# Patient Record
Sex: Male | Born: 1950 | Race: White | Hispanic: No | Marital: Married | Smoking: Never smoker
Health system: Southern US, Community
[De-identification: ages and names within clinical notes are randomized; demographics above are authoritative.]

---

## 2005-09-17 ENCOUNTER — Other Ambulatory Visit: Payer: Self-pay

## 2005-09-17 ENCOUNTER — Emergency Department: Payer: Self-pay | Admitting: Emergency Medicine

## 2009-02-13 ENCOUNTER — Ambulatory Visit: Payer: Self-pay | Admitting: Surgery

## 2009-02-19 ENCOUNTER — Ambulatory Visit: Payer: Self-pay | Admitting: Surgery

## 2009-05-02 ENCOUNTER — Ambulatory Visit: Payer: Self-pay | Admitting: Gastroenterology

## 2015-02-27 ENCOUNTER — Other Ambulatory Visit: Payer: Self-pay | Admitting: Preventative Medicine

## 2015-02-27 DIAGNOSIS — N5089 Other specified disorders of the male genital organs: Secondary | ICD-10-CM

## 2015-03-01 ENCOUNTER — Ambulatory Visit
Admission: RE | Admit: 2015-03-01 | Discharge: 2015-03-01 | Disposition: A | Discharge status: Home / Self Care | Payer: Medicare HMO | Source: Ambulatory Visit | Attending: Preventative Medicine | Admitting: Preventative Medicine

## 2015-03-01 DIAGNOSIS — N433 Hydrocele, unspecified: Secondary | ICD-10-CM | POA: Insufficient documentation

## 2015-03-01 DIAGNOSIS — N509 Disorder of male genital organs, unspecified: Secondary | ICD-10-CM | POA: Insufficient documentation

## 2015-03-01 DIAGNOSIS — N5089 Other specified disorders of the male genital organs: Secondary | ICD-10-CM

## 2016-05-14 IMAGING — US US ART/VEN ABD/PELV/SCROTUM DOPPLER LTD
1 series · 13 of 25 positions shown · non-contrast
Comparison: None in PACs

CLINICAL DATA: [DATE] week history of left-sided scrotal swelling and
mass.

EXAM:
SCROTAL ULTRASOUND
DOPPLER ULTRASOUND OF THE TESTICLES
TECHNIQUE: Complete ultrasound examination of the testicles, epididymis, and
other scrotal structures was performed. Color and spectral Doppler
ultrasound were also utilized to evaluate blood flow to the
testicles.

[Series 1: us art/ven abd/pelv/scrotum doppler ltd · 0.09mm/px · 13 of 114 slices shown]
[im 1/114]
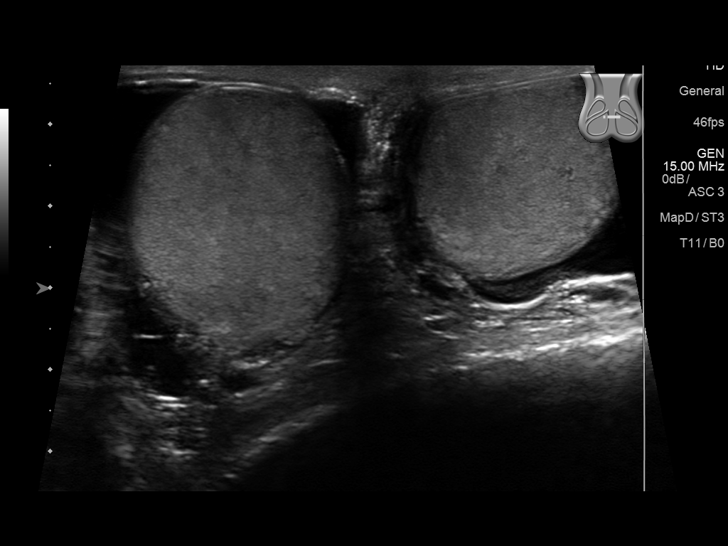
[im 10/114]
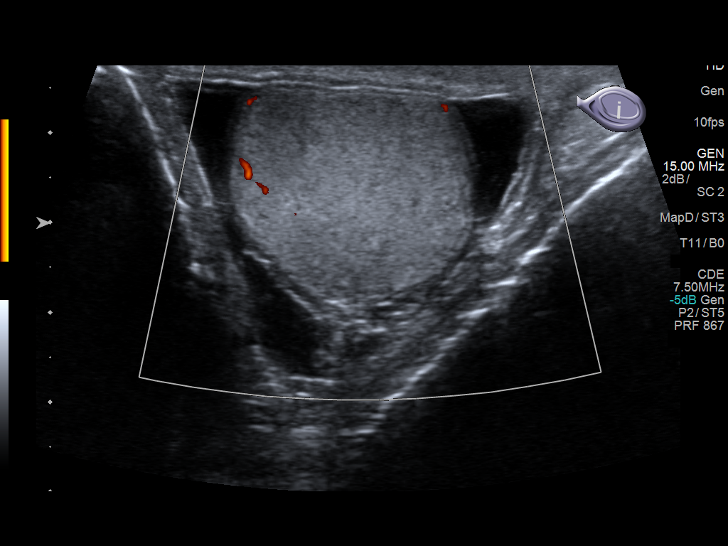
[im 19/114]
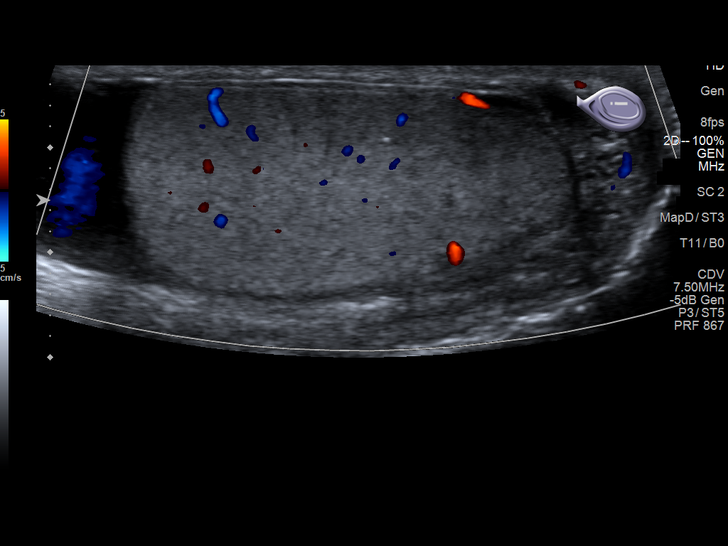
[im 29/114]
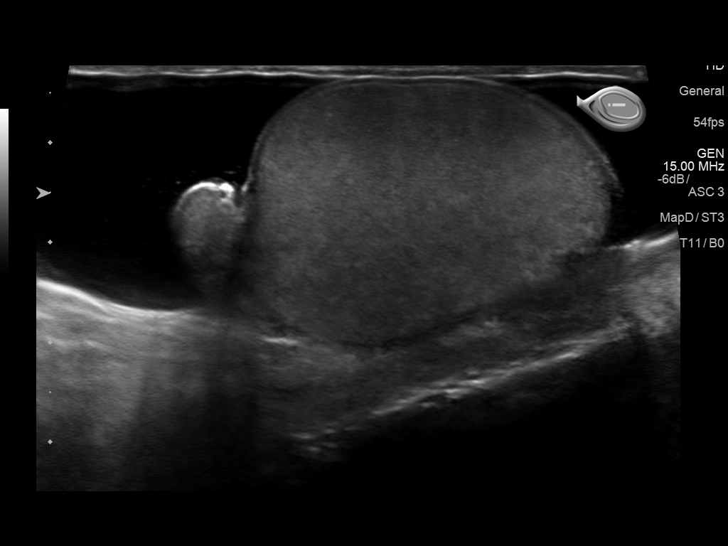
[im 38/114]
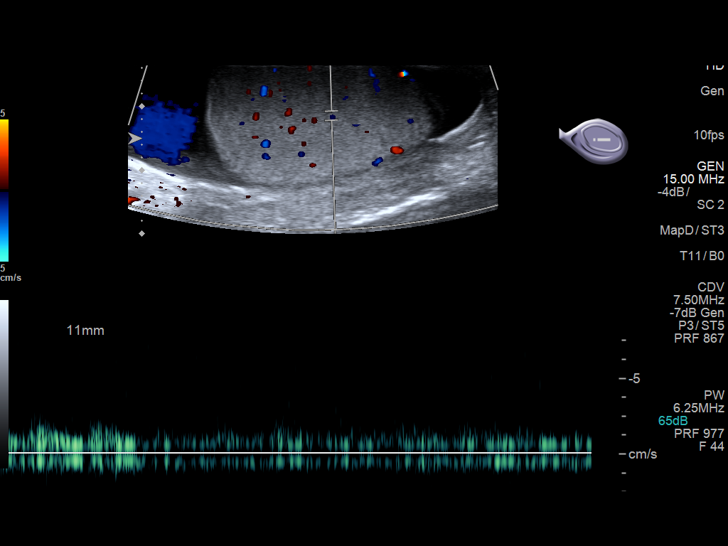
[im 48/114]
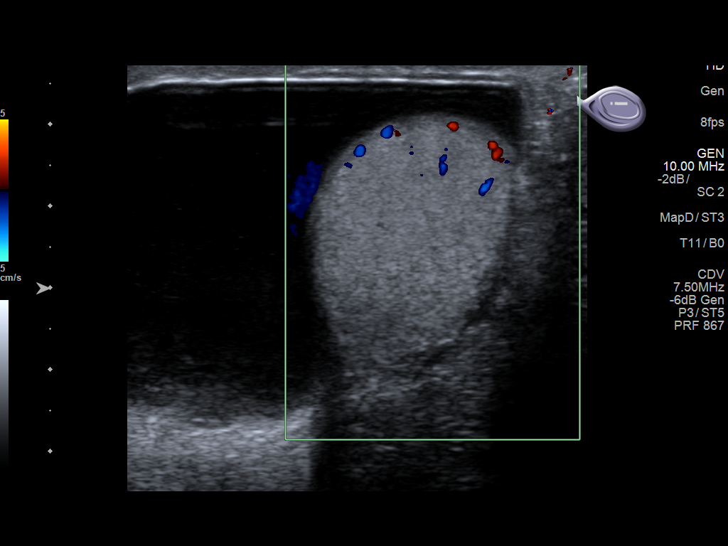
[im 57/114]
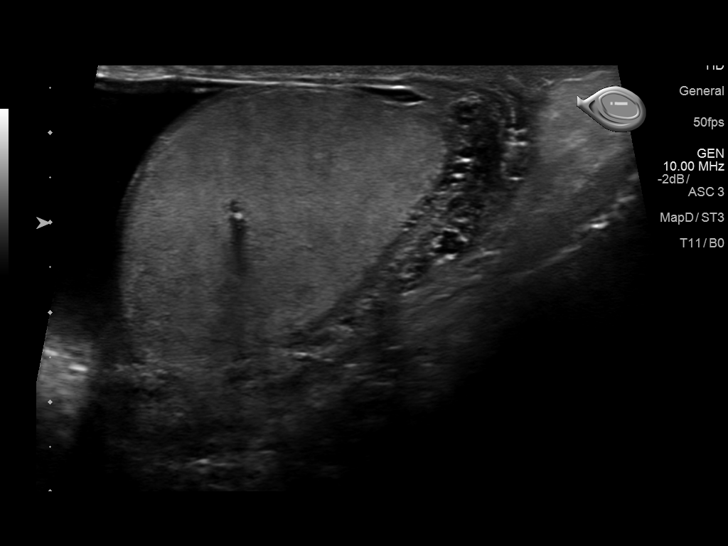
[im 66/114]
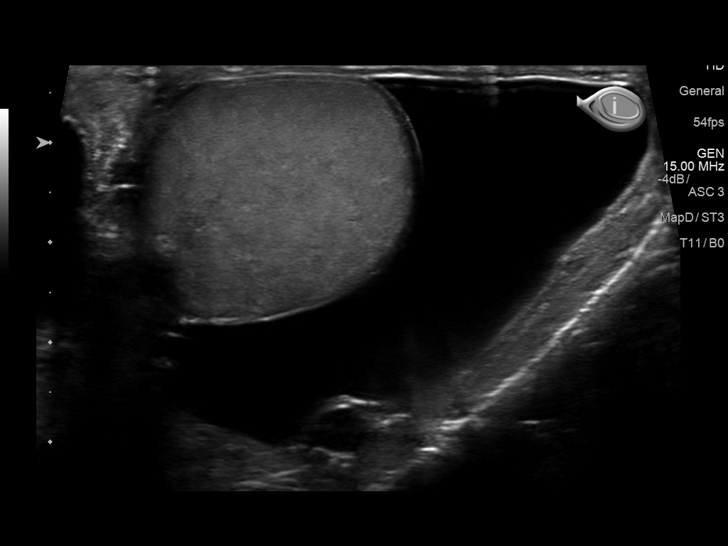
[im 76/114]
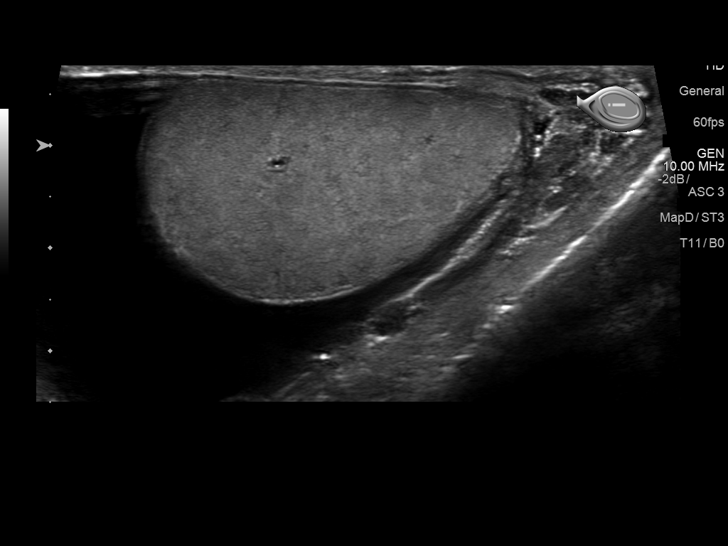
[im 85/114]
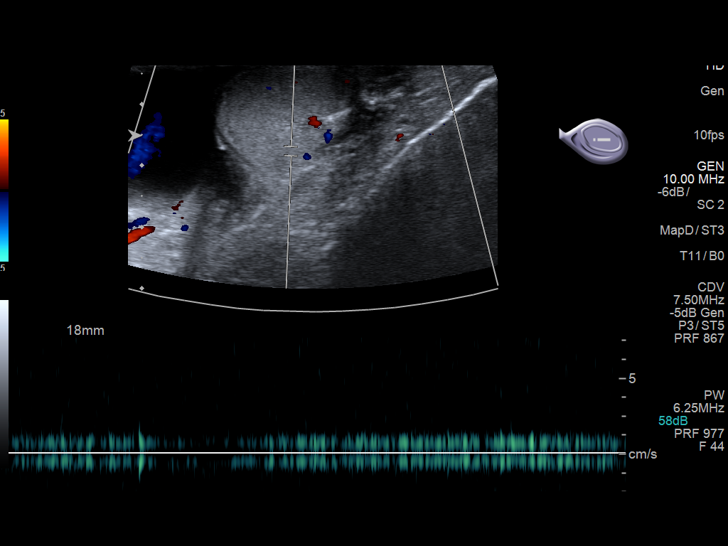
[im 95/114]
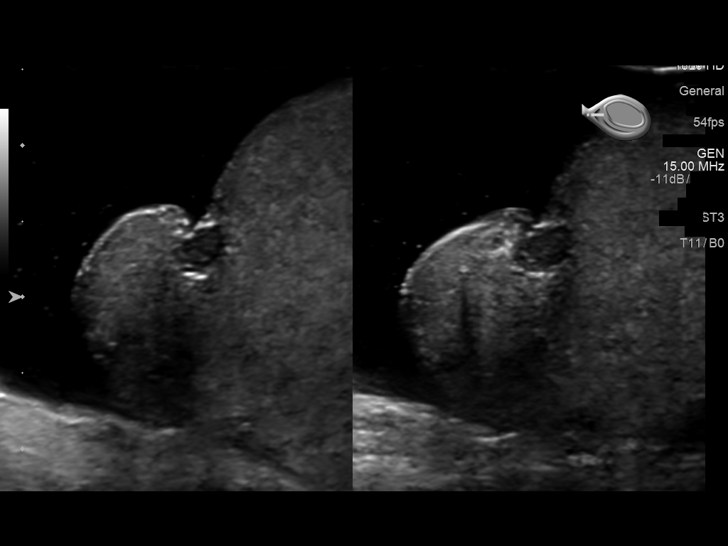
[im 104/114]
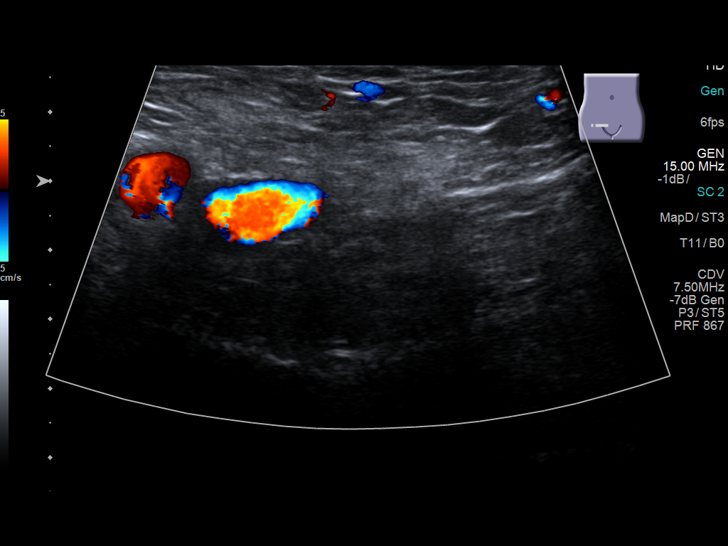
[im 114/114]
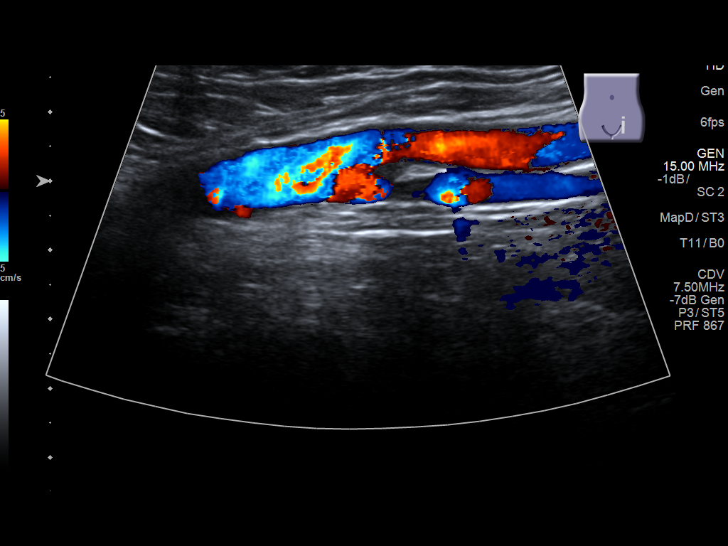

[13 of 25 positions shown; findings below may reference images not displayed]

FINDINGS: Right testicle

Measurements: 4.3 x 2.1 x 2.7 cm. No mass or microlithiasis
visualized.

Left testicle

Measurements: 3.7 x 2.7 x 2.7 cm. No mass or microlithiasis
visualized.

Right epididymis: The epididymis exhibits normal echotexture and
vascularity. There is a tiny epididymal cyst measuring 4 mm in
greatest dimension.

Left epididymis:  Normal in size and appearance.

Hydrocele:  There are moderate-sized bilateral hydroceles.

Varicocele:  None visualized.

Pulsed Doppler interrogation of both testes demonstrates normal low
resistance arterial and venous waveforms bilaterally.

Interrogation of the left inguinal canal reveals no evidence of a
hernia.
IMPRESSION: 1. The testes exhibit normal echotexture and vascularity. No
testicular masses are observed. Testicular vascularity is normal.
2. No evidence of epididymal masses or acute epididymitis.
3. Bilateral hydroceles.  No varicocele is observed.
4. No left inguinal hernia is demonstrated.

## 2020-03-28 ENCOUNTER — Ambulatory Visit: Payer: Medicare HMO | Admitting: Dermatology

## 2020-03-28 ENCOUNTER — Other Ambulatory Visit: Payer: Self-pay

## 2020-03-28 DIAGNOSIS — L719 Rosacea, unspecified: Secondary | ICD-10-CM

## 2020-03-28 DIAGNOSIS — L820 Inflamed seborrheic keratosis: Secondary | ICD-10-CM

## 2020-03-28 DIAGNOSIS — L578 Other skin changes due to chronic exposure to nonionizing radiation: Secondary | ICD-10-CM

## 2020-03-28 NOTE — Patient Instructions (Addendum)
Seborrheic Keratosis  What causes seborrheic keratoses? Seborrheic keratoses are harmless, common skin growths that first appear during adult life.  As time goes by, more growths appear.  Some people may develop a large number of them.  Seborrheic keratoses appear on both covered and uncovered body parts.  They are not caused by sunlight.  The tendency to develop seborrheic keratoses can be inherited.  They vary in color from skin-colored to gray, brown, or even black.  They can be either smooth or have a rough, warty surface.   Seborrheic keratoses are superficial and look as if they were stuck on the skin.  Under the microscope this type of keratosis looks like layers upon layers of skin.  That is why at times the top layer may seem to fall off, but the rest of the growth remains and re-grows.    Treatment Seborrheic keratoses do not need to be treated, but can easily be removed in the office.  Seborrheic keratoses often cause symptoms when they rub on clothing or jewelry.  Lesions can be in the way of shaving.  If they become inflamed, they can cause itching, soreness, or burning.  Removal of a seborrheic keratosis can be accomplished by freezing, burning, or surgery. If any spot bleeds, scabs, or grows rapidly, please return to have it checked, as these can be an indication of a skin cancer.     Instructions for [facility] Medications  One or more of your medications was sent to the [facility] mail order compounding pharmacy. You will receive an email from them and can purchase the medicine through that link. It will then be mailed to your home at the address you confirmed. If for any reason you do not receive an email from them, please check your spam folder. If you still do not find the email, please let us know. [facility] phone number is [phone].

## 2020-03-28 NOTE — Progress Notes (Signed)
   Follow-Up Visit   Subjective  Edward Garza is a 70 y.o. male who presents for the following: Spot Check (Pt would like to have a few spots on his back and chest checked today. Pt unsure if new or changed.).  The following portions of the chart were reviewed this encounter and updated as appropriate:  Allergies  Meds  Problems  Med Hx  Surg Hx  Fam Hx     Review of Systems: No other skin or systemic complaints except as noted in HPI or Assessment and Plan.  Objective  Well appearing patient in no apparent distress; mood and affect are within normal limits.  A focused examination was performed including back and chest. Relevant physical exam findings are noted in the Assessment and Plan.  Objective  back x 2, chest x 1, right forearm x 4, left forearm x 2 (9): Erythematous keratotic or waxy stuck-on papule or plaque.   Objective  Head - Anterior (Face): Pustules and erythema on forehead  Assessment & Plan  Inflamed seborrheic keratosis (9) back x 2, chest x 1, right forearm x 4, left forearm x 2  Destruction of lesion - back x 2, chest x 1, right forearm x 4, left forearm x 2 Complexity: simple   Destruction method: cryotherapy   Informed consent: discussed and consent obtained   Timeout:  patient name, date of birth, surgical site, and procedure verified Lesion destroyed using liquid nitrogen: Yes   Region frozen until ice ball extended beyond lesion: Yes   Outcome: patient tolerated procedure well with no complications   Post-procedure details: wound care instructions given    Rosacea Head - Anterior (Face) Start triple rosacea cream from [facility].  Will consider oral doxycycline in future if cream does not help.  Will prescribe [facility] metronidazole/ivermectin/azelaic acid twice daily as needed to affected areas on the face. The patient was advised this is not covered by insurance since it is made by a compounding pharmacy. They will receive a  phone call to check out and the medication will be mailed to their home.   Rosacea is a chronic progressive skin condition usually affecting the face of adults, causing redness and/or acne bumps. It is treatable but not curable. It sometimes affects the eyes (ocular rosacea) as well. It may respond to topical and/or systemic medication and can flare with stress, sun exposure, alcohol, exercise and some foods.  Daily application of broad spectrum spf 30+ sunscreen to face is recommended to reduce flares.  Actinic Damage - chronic, secondary to cumulative UV radiation exposure/sun exposure over time - diffuse scaly erythematous macules with underlying dyspigmentation - Recommend daily broad spectrum sunscreen SPF 30+ to sun-exposed areas, reapply every 2 hours as needed.  - Call for new or changing lesions.  Return in about 1 year (around 03/28/2021) for TSBE .   IHarriett Sine, CMA, am acting as scribe for Sarina Ser, MD.  Documentation: I have reviewed the above documentation for accuracy and completeness, and I agree with the above.  Sarina Ser, MD

## 2020-03-30 ENCOUNTER — Encounter: Payer: Self-pay | Admitting: Dermatology

## 2020-10-01 ENCOUNTER — Ambulatory Visit: Payer: Medicare HMO | Admitting: Urology

## 2020-10-01 ENCOUNTER — Other Ambulatory Visit: Payer: Self-pay

## 2020-10-01 ENCOUNTER — Encounter: Payer: Self-pay | Admitting: Urology

## 2020-10-01 VITALS — BP 138/80 | HR 64 | Ht 64.0 in | Wt 165.0 lb

## 2020-10-01 DIAGNOSIS — R972 Elevated prostate specific antigen [PSA]: Secondary | ICD-10-CM

## 2020-10-01 NOTE — Patient Instructions (Signed)
Prostate Cancer Screening Prostate cancer screening is a test that is done to check for the presence of prostate cancer in men. The prostate gland is a walnut-sized gland that is located below the bladder and in front of the rectum in males. The function of the prostate is to add fluid to semen during ejaculation. Prostate cancer is the second most common type of cancer in men. Who should have prostate cancer screening? Screening recommendations vary based on age and other risk factors. Screening is recommended if: You are older than age 78. If you are age 36-69, talk with your health care provider about your need for screening and how often screening should be done. Because most prostate cancers are slow growing and will not cause death, screening is generally reserved in this age group for men who have a 10-15-year life expectancy. You are younger than age 62, and you have these risk factors: Being a [place] male or a male of African descent. Having a father, brother, or uncle who has been diagnosed with prostate cancer. The risk is higher if your family member's cancer occurred at an early age. Screening is not recommended if: You are younger than age 16. You are between the ages of 24 and 33 and you have no risk factors. You are 34 years of age or older. At this age, the risks that screening can cause are greater than the benefits that it may provide. If you are at high risk for prostate cancer, your health care provider may recommend that you have screenings more often or that you start screening at a younger age. How is screening for prostate cancer done? The recommended prostate cancer screening test is a blood test called the prostate-specific antigen (PSA) test. PSA is a protein that is made in the prostate. As you age, your prostate naturally produces more PSA. Abnormally high PSA levels may be caused by: Prostate cancer. An enlarged prostate that is not caused by cancer (benign prostatic  hyperplasia, BPH). This condition is very common in older men. A prostate gland infection (prostatitis). Depending on the PSA results, you may need more tests, such as: A physical exam to check the size of your prostate gland. Blood and imaging tests. A procedure to remove tissue samples from your prostate gland for testing (biopsy). What are the benefits of prostate cancer screening? Screening can help to identify cancer at an early stage, before symptoms start and when the cancer can be treated more easily. There is a small chance that screening may lower your risk of dying from prostate cancer. The chance is small because prostate cancer is a slow-growing cancer, and most men with prostate cancer die from a different cause. What are the risks of prostate cancer screening? The main risk of prostate cancer screening is diagnosing and treating prostate cancer that would never have caused any symptoms or problems. This is called overdiagnosisand overtreatment. PSA screening cannot tell you if your PSA is high due to cancer or a different cause. A prostate biopsy is the only procedure to diagnose prostate cancer. Even the results of a biopsy may not tell you if your cancer needs to be treated. Slow-growing prostate cancer may not need any treatment other than monitoring, so diagnosing and treating it may cause unnecessary stress or other side effects. Questions to ask your health care provider When should I start prostate cancer screening? What is my risk for prostate cancer? How often do I need screening? What type of screening tests do  I need? How do I get my test results? What do my results mean? Do I need treatment? Where to find more information The American Cancer Society: www.cancer.org American Urological Association: www.auanet.org Contact a health care provider if: You have difficulty urinating. You have pain when you urinate or ejaculate. You have blood in your urine or semen. You  have pain in your back or in the area of your prostate. Summary Prostate cancer is a common type of cancer in men. The prostate gland is located below the bladder and in front of the rectum. This gland adds fluid to semen during ejaculation. Prostate cancer screening may identify cancer at an early stage, when the cancer can be treated more easily. The prostate-specific antigen (PSA) test is the recommended screening test for prostate cancer. Discuss the risks and benefits of prostate cancer screening with your health care provider. If you are age 40 or older, the risks that screening can cause are greater than the benefits that it may provide. This information is not intended to replace advice given to you by your health care provider. Make sure you discuss any questions you have with your health care provider. Document Revised: 03/22/2020 Document Reviewed: 09/01/2018 Elsevier Patient Education  [facility] (11th ed., pp. [id number]). Elsevier.">  Transrectal Ultrasound-Guided Prostate Biopsy A transrectal ultrasound-guided prostate biopsy is a procedure to remove samples of prostate tissue for testing. The procedure uses ultrasound images to guide the process of removing the samples. The samples are taken to a lab to bechecked for prostate cancer. This procedure is usually done to evaluate the prostate gland of men who have raised (elevated) levels of prostate-specific antigen (PSA), which can be a sign of prostatecancer. Tell a health care provider about: Any allergies you have. All medicines you are taking, including vitamins, herbs, eye drops, creams, and over-the-counter medicines. Any problems you or family members have had with anesthetic medicines. Any blood disorders you have. Any surgeries you have had. Any medical conditions you have. What are the risks? Generally, this is a safe procedure. However, problems may occur, including: Prostate  infection. Bleeding from the rectum. Blood in the urine. Allergic reactions to medicines. Damage to surrounding structures such as blood vessels, organs, or muscles. Difficulty passing urine. Nerve damage. This is usually temporary. Pain. What happens before the procedure? Eating and drinking restrictions Follow instructions from your health care provider about eating and drinking. In most instances, you will not need to stop eating and drinking completelybefore the procedure. Medicines Ask your health care provider about: Changing or stopping your regular medicines. This is especially important if you are taking diabetes medicines or blood thinners. Taking medicines such as aspirin and ibuprofen. These medicines can thin your blood. Do not take these medicines unless your health care provider tells you to take them. Taking over-the-counter medicines, vitamins, herbs, and supplements. General instructions You will be given an enema. During an enema, a liquid is injected into your rectum to clear out waste. You may have a blood or urine sample taken. Plan to have a responsible adult take you home from the hospital or clinic. If you will be going home right after the procedure, plan to have a responsible adult care for you for the time you are told. This is important. Ask your health care provider what steps will be taken to help prevent infection. These steps may include: Washing skin with a germ-killing soap. Taking antibiotic medicine. What happens during the procedure?  You  will be given one or both of the following: A medicine to help you relax (sedative). A medicine to numb the area (local anesthetic). You will be placed on your left side, and your knees may be bent. A probe with lubricated gel will be placed into your rectum, and images will be taken of your prostate and surrounding structures. Numbing medicine will be injected into your prostate. A biopsy needle will be inserted  through your rectum and guided to your prostate using the ultrasound images. Prostate tissue samples will be removed, and the needle will then be removed. The biopsy samples will be sent to a lab to be tested. The procedure may vary among health care providers and hospitals. What happens after the procedure? Your blood pressure, heart rate, breathing rate, and blood oxygen level will be monitored until the medicines you were given have worn off. You may have some discomfort in the rectal area. You will be given pain medicine as needed. Do not drive for 24 hours if you received a sedative. Summary A transrectal ultrasound-guided biopsy removes samples of tissue from your prostate. This procedure is usually done to evaluate the prostate gland of men who have raised (elevated) levels of prostate-specific antigen (PSA), which can be a sign of prostate cancer. After your procedure, you may feel some discomfort in the rectal area. Plan to have a responsible adult take you home from the hospital or clinic. This information is not intended to replace advice given to you by your health care provider. Make sure you discuss any questions you have with your healthcare provider. Document Revised: 11/03/2019 Document Reviewed: 10/05/2019 Elsevier Patient Education  2022 [employer].

## 2020-10-01 NOTE — Progress Notes (Signed)
   10/01/20 12:45 PM   Geraldo W Yankowski 09/02/1950 [id number]  CC: Elevated PSA  HPI: I saw Mr. Amsler and his wife today for evaluation of a elevated PSA.  He is a healthy 70 year old male who had a single PSA checked on 09/24/2020 with PCP at [facility], and this was mildly elevated at 4.3.  There are no prior PSA values to review.  He has no family history of prostate or breast cancer.  He denies any urinary symptoms, gross hematuria, history of retention, or history of UTI.  Social History:  reports that he has never smoked. His smokeless tobacco use includes chew. He reports current alcohol use. He reports that he does not use drugs.  Physical Exam: BP 138/80   Pulse 64   Ht '5\' 4"'$  (1.626 m)   Wt 165 lb (74.8 kg)   BMI 28.32 kg/m    Constitutional:  Alert and oriented, No acute distress. Cardiovascular: No clubbing, cyanosis, or edema. Respiratory: Normal respiratory effort, no increased work of breathing. GI: Abdomen is soft, nontender, nondistended, no abdominal masses DRE: 40 g, smooth, no nodules or masses   Laboratory Data: Reviewed, see HPI  Pertinent Imaging: None to review  Assessment & Plan:   70 year old male with single mildly elevated PSA of 4.3, normal DRE, no family history of prostate or breast cancer.  We reviewed the implications of an elevated PSA and the uncertainty surrounding it. In general, a man's PSA increases with age and is produced by both normal and cancerous prostate tissue. The differential diagnosis for elevated PSA includes BPH, prostate cancer, infection, recent intercourse/ejaculation, recent urethroscopic manipulation (foley placement/cystoscopy) or trauma, and prostatitis.   Management of an elevated PSA can include observation or prostate biopsy and we discussed this in detail. Our goal is to detect clinically significant prostate cancers, and manage with either active surveillance, surgery, or radiation for localized disease. Risks of  prostate biopsy include bleeding, infection (including life threatening sepsis), pain, and lower urinary symptoms. Hematuria, hematospermia, and blood in the stool are all common after biopsy and can persist up to 4 weeks.   Repeat PSA with reflex to free in 1 to 2 months, consider biopsy if increasing versus trending if stable   Nickolas Madrid, MD 10/01/2020  [facility] [address] [place], [place] [address] [phone]

## 2020-10-31 ENCOUNTER — Other Ambulatory Visit
Admission: RE | Admit: 2020-10-31 | Discharge: 2020-10-31 | Disposition: A | Discharge status: Home / Self Care | Payer: Medicare HMO | Attending: Urology | Admitting: Urology

## 2020-10-31 ENCOUNTER — Other Ambulatory Visit: Payer: Self-pay

## 2020-10-31 DIAGNOSIS — R972 Elevated prostate specific antigen [PSA]: Secondary | ICD-10-CM | POA: Diagnosis present

## 2020-11-01 LAB — PSA, TOTAL AND FREE
PSA, Free Pct: 21.1 %
PSA, Free: 0.93 ng/mL
Prostate Specific Ag, Serum: 4.4 ng/mL — ABNORMAL HIGH (ref 0.0–4.0)

## 2020-11-05 ENCOUNTER — Telehealth: Payer: Self-pay

## 2020-11-05 DIAGNOSIS — R972 Elevated prostate specific antigen [PSA]: Secondary | ICD-10-CM

## 2020-11-05 NOTE — Telephone Encounter (Signed)
Called pt informed him of the information below. Pt and wife voiced understanding.

## 2020-11-05 NOTE — Telephone Encounter (Signed)
-----   Message from Billey Co, MD sent at 11/05/2020  8:25 AM EDT ----- PSA stable, likely from enlarged prostate, not cancer.  Recommend follow-up in 6 months with PSA reflex to free, thanks  Nickolas Madrid, MD 11/05/2020

## 2021-04-02 ENCOUNTER — Encounter: Payer: Medicare HMO | Admitting: Dermatology

## 2021-05-06 ENCOUNTER — Ambulatory Visit: Payer: Medicare HMO | Admitting: Urology

## 2021-05-06 ENCOUNTER — Other Ambulatory Visit
Admission: RE | Admit: 2021-05-06 | Discharge: 2021-05-06 | Disposition: A | Discharge status: Home / Self Care | Payer: Medicare HMO | Attending: Urology | Admitting: Urology

## 2021-05-06 ENCOUNTER — Encounter: Payer: Self-pay | Admitting: Urology

## 2021-05-06 VITALS — BP 161/89 | HR 71 | Ht 64.0 in | Wt 167.2 lb

## 2021-05-06 DIAGNOSIS — R972 Elevated prostate specific antigen [PSA]: Secondary | ICD-10-CM | POA: Insufficient documentation

## 2021-05-06 DIAGNOSIS — N521 Erectile dysfunction due to diseases classified elsewhere: Secondary | ICD-10-CM

## 2021-05-06 DIAGNOSIS — Z125 Encounter for screening for malignant neoplasm of prostate: Secondary | ICD-10-CM

## 2021-05-06 MED ORDER — TADALAFIL 5 MG PO TABS
ORAL_TABLET | ORAL | 11 refills | Status: DC
Start: 2021-05-06 — End: 2022-05-12

## 2021-05-06 NOTE — Patient Instructions (Signed)
Prostate Cancer Screening ?Prostate cancer screening is testing that is done to check for the presence of prostate cancer in men. The prostate gland is a walnut-sized gland that is located below the bladder and in front of the rectum in males. The function of the prostate is to add fluid to semen during ejaculation. Prostate cancer is one of the most common types of cancer in men. ?Who should have prostate cancer screening? ?Screening recommendations vary based on age and other risk factors, as well as between the professional organizations who make the recommendations. ?In general, screening is recommended if: ?You are age 50 to 70 and have an average risk for prostate cancer. You should talk with your health care provider about your need for screening and how often screening should be done. Because most prostate cancers are slow growing and will not cause death, screening in this age group is generally reserved for men who have a 10- to 15-year life expectancy. ?You are younger than age 50, and you have these risk factors: ?Having a father, brother, or uncle who has been diagnosed with prostate cancer. The risk is higher if your family member's cancer occurred at an early age or if you have multiple family members with prostate cancer at an early age. ?Being a male who is Black or is of Caribbean or sub-Saharan African descent. ?In general, screening is not recommended if: ?You are younger than age 40. ?You are between the ages of 40 and 49 and you have no risk factors. ?You are 70 years of age or older. At this age, the risks that screening can cause are greater than the benefits that it may provide. ?If you are at high risk for prostate cancer, your health care provider may recommend that you have screenings more often or that you start screening at a younger age. ?How is screening for prostate cancer done? ?The recommended prostate cancer screening test is a blood test called the prostate-specific antigen (PSA)  test. PSA is a protein that is made in the prostate. As you age, your prostate naturally produces more PSA. Abnormally high PSA levels may be caused by: ?Prostate cancer. ?An enlarged prostate that is not caused by cancer (benign prostatic hyperplasia, or BPH). This condition is very common in older men. ?A prostate gland infection (prostatitis) or urinary tract infection. ?Certain medicines such as male hormones (like testosterone) or other medicines that raise testosterone levels. ?A rectal exam may be done as part of prostate cancer screening to help provide information about the size of your prostate gland. When a rectal exam is performed, it should be done after the PSA level is drawn to avoid any effect on the results. ?Depending on the PSA results, you may need more tests, such as: ?A physical exam to check the size of your prostate gland, if not done as part of screening. ?Blood and imaging tests. ?A procedure to remove tissue samples from your prostate gland for testing (biopsy). This is the only way to know for certain if you have prostate cancer. ?What are the benefits of prostate cancer screening? ?Screening can help to identify cancer at an early stage, before symptoms start and when the cancer can be treated more easily. ?There is a small chance that screening may lower your risk of dying from prostate cancer. The chance is small because prostate cancer is a slow-growing cancer, and most men with prostate cancer die from a different cause. ?What are the risks of prostate cancer screening? ?The main   risk of prostate cancer screening is diagnosing and treating prostate cancer that would never have caused any symptoms or problems. This is called overdiagnosisand overtreatment. PSA screening cannot tell you if your PSA is high due to cancer or a different cause. A prostate biopsy is the only procedure to diagnose prostate cancer. Even the results of a biopsy may not tell you if your cancer needs to be  treated. Slow-growing prostate cancer may not need any treatment other than monitoring, so diagnosing and treating it may cause unnecessary stress or other side effects. ?Questions to ask your health care provider ?When should I start prostate cancer screening? ?What is my risk for prostate cancer? ?How often do I need screening? ?What type of screening tests do I need? ?How do I get my test results? ?What do my results mean? ?Do I need treatment? ?Where to find more information ?The American Cancer Society: www.cancer.org ?American Urological Association: www.auanet.org ?Contact a health care provider if: ?You have difficulty urinating. ?You have pain when you urinate or ejaculate. ?You have blood in your urine or semen. ?You have pain in your back or in the area of your prostate. ?Summary ?Prostate cancer is a common type of cancer in men. The prostate gland is located below the bladder and in front of the rectum. This gland adds fluid to semen during ejaculation. ?Prostate cancer screening may identify cancer at an early stage, when the cancer can be treated more easily and is less likely to have spread to other areas of the body. ?The prostate-specific antigen (PSA) test is the recommended screening test for prostate cancer, but it has associated risks. ?Discuss the risks and benefits of prostate cancer screening with your health care provider. If you are age 70 or older, the risks that screening can cause are greater than the benefits that it may provide. ?This information is not intended to replace advice given to you by your health care provider. Make sure you discuss any questions you have with your health care provider. ?Document Revised: 07/15/2020 Document Reviewed: 07/15/2020 ?Elsevier Patient Education ? 2022 Elsevier Inc. ? ?

## 2021-05-06 NOTE — Progress Notes (Signed)
? ?  05/06/2021 ?12:33 PM  ? ?Edward Garza ?14-Nov-1950 ?[id number] ? ?Reason for visit: Follow up PSA screening, ED ? ?HPI: ?71 year old healthy male who I originally saw in August 2022 for a mildly elevated PSA of 4.3.  DRE was normal and he opted for a repeat PSA in September 2022 which was stable at 4.4 with reassuring 21% free.  He opted to repeat the PSA in 6 months, and PSA today is pending.  We again reviewed at length the AUA guidelines regarding PSA screening and the risks and benefits. ? ?He also reports problems with erections over the last few years.  He used the medication a few years ago that was helpful but he does not remember what that was.  He is interested in trying PDE 5 inhibitor at this point.  We discussed the risk and benefits and differences between Cialis and Viagra, and he is interested in a trial of Cialis 5-10 mg as needed. ? ?Trial of Cialis 5 to 10 mg as needed, okay to titrate dose as needed ?Call with PSA results, if stable consider repeat PSA in 1 year ?Consider prostate MRI if PSA increased significantly ? ? ?Billey Co, MD ? ?[facility] ?[address] ?[place],  [address] ?[phone] ? ? ?

## 2021-05-07 LAB — PSA, TOTAL AND FREE
PSA, Free Pct: 25.3 %
PSA, Free: 1.14 ng/mL
Prostate Specific Ag, Serum: 4.5 ng/mL — ABNORMAL HIGH (ref 0.0–4.0)

## 2021-05-08 ENCOUNTER — Telehealth: Payer: Self-pay

## 2021-05-08 DIAGNOSIS — R972 Elevated prostate specific antigen [PSA]: Secondary | ICD-10-CM

## 2021-05-08 NOTE — Telephone Encounter (Signed)
-----   Message from Billey Co, MD sent at 05/08/2021  8:10 AM EDT ----- ?Good news, PSA stable and reassuring.  Recommend 1 year follow-up with PSA reflex to free ? ? ? ?Nickolas Madrid, MD ?05/08/2021 ? ? ?

## 2021-09-15 ENCOUNTER — Ambulatory Visit: Payer: Medicare HMO | Admitting: Dermatology

## 2021-09-15 DIAGNOSIS — L570 Actinic keratosis: Secondary | ICD-10-CM

## 2021-09-15 DIAGNOSIS — C4491 Basal cell carcinoma of skin, unspecified: Secondary | ICD-10-CM

## 2021-09-15 DIAGNOSIS — C44311 Basal cell carcinoma of skin of nose: Secondary | ICD-10-CM | POA: Diagnosis not present

## 2021-09-15 DIAGNOSIS — D2322 Other benign neoplasm of skin of left ear and external auricular canal: Secondary | ICD-10-CM

## 2021-09-15 DIAGNOSIS — L814 Other melanin hyperpigmentation: Secondary | ICD-10-CM

## 2021-09-15 DIAGNOSIS — Z1283 Encounter for screening for malignant neoplasm of skin: Secondary | ICD-10-CM

## 2021-09-15 DIAGNOSIS — L578 Other skin changes due to chronic exposure to nonionizing radiation: Secondary | ICD-10-CM

## 2021-09-15 DIAGNOSIS — L719 Rosacea, unspecified: Secondary | ICD-10-CM

## 2021-09-15 DIAGNOSIS — L820 Inflamed seborrheic keratosis: Secondary | ICD-10-CM

## 2021-09-15 DIAGNOSIS — L821 Other seborrheic keratosis: Secondary | ICD-10-CM

## 2021-09-15 DIAGNOSIS — D239 Other benign neoplasm of skin, unspecified: Secondary | ICD-10-CM

## 2021-09-15 DIAGNOSIS — D485 Neoplasm of uncertain behavior of skin: Secondary | ICD-10-CM

## 2021-09-15 DIAGNOSIS — D692 Other nonthrombocytopenic purpura: Secondary | ICD-10-CM

## 2021-09-15 HISTORY — DX: Basal cell carcinoma of skin, unspecified: C44.91

## 2021-09-15 MED ORDER — METRONIDAZOLE 0.75 % EX GEL: CUTANEOUS | 5 refills | Status: AC

## 2021-09-15 NOTE — Patient Instructions (Addendum)
Cryotherapy Aftercare  Wash gently with soap and water everyday.   Apply Vaseline and Band-Aid daily until healed.   Wound Care Instructions  Cleanse wound gently with soap and water once a day then pat dry with clean gauze. Apply a thin coat of Petrolatum (petroleum jelly, "Vaseline") over the wound (unless you have an allergy to this). We recommend that you use a new, sterile tube of Vaseline. Do not pick or remove scabs. Do not remove the yellow or white "healing tissue" from the base of the wound.  Cover the wound with fresh, clean, nonstick gauze and secure with paper tape. You may use Band-Aids in place of gauze and tape if the wound is small enough, but would recommend trimming much of the tape off as there is often too much. Sometimes Band-Aids can irritate the skin.  You should call the office for your biopsy report after 1 week if you have not already been contacted.  If you experience any problems, such as abnormal amounts of bleeding, swelling, significant bruising, significant pain, or evidence of infection, please call the office immediately.  FOR ADULT SURGERY PATIENTS: If you need something for pain relief you may take 1 extra strength Tylenol (acetaminophen) AND 2 Ibuprofen (200mg each) together every 4 hours as needed for pain. (do not take these if you are allergic to them or if you have a reason you should not take them.) Typically, you may only need pain medication for 1 to 3 days.     Seborrheic Keratosis  What causes seborrheic keratoses? Seborrheic keratoses are harmless, common skin growths that first appear during adult life.  As time goes by, more growths appear.  Some people may develop a large number of them.  Seborrheic keratoses appear on both covered and uncovered body parts.  They are not caused by sunlight.  The tendency to develop seborrheic keratoses can be inherited.  They vary in color from skin-colored to gray, brown, or even black.  They can be either  smooth or have a rough, warty surface.   Seborrheic keratoses are superficial and look as if they were stuck on the skin.  Under the microscope this type of keratosis looks like layers upon layers of skin.  That is why at times the top layer may seem to fall off, but the rest of the growth remains and re-grows.    Treatment Seborrheic keratoses do not need to be treated, but can easily be removed in the office.  Seborrheic keratoses often cause symptoms when they rub on clothing or jewelry.  Lesions can be in the way of shaving.  If they become inflamed, they can cause itching, soreness, or burning.  Removal of a seborrheic keratosis can be accomplished by freezing, burning, or surgery. If any spot bleeds, scabs, or grows rapidly, please return to have it checked, as these can be an indication of a skin cancer.     Due to recent changes in healthcare laws, you may see results of your pathology and/or laboratory studies on MyChart before the doctors have had a chance to review them. We understand that in some cases there may be results that are confusing or concerning to you. Please understand that not all results are received at the same time and often the doctors may need to interpret multiple results in order to provide you with the best plan of care or course of treatment. Therefore, we ask that you please give us 2 business days to thoroughly review all your   results before contacting the office for clarification. Should we see a critical lab result, you will be contacted sooner.   If You Need Anything After Your Visit  If you have any questions or concerns for your doctor, please call our main line at [phone] and press option 4 to reach your doctor's medical assistant. If no one answers, please leave a voicemail as directed and we will return your call as soon as possible. Messages left after 4 pm will be answered the following business day.   You may also send us a message via MyChart. We  typically respond to MyChart messages within 1-2 business days.  For prescription refills, please ask your pharmacy to contact our office. Our fax number is [phone].  If you have an urgent issue when the clinic is closed that cannot wait until the next business day, you can page your doctor at the number below.    Please note that while we do our best to be available for urgent issues outside of office hours, we are not available 24/7.   If you have an urgent issue and are unable to reach us, you may choose to seek medical care at your doctor's office, retail clinic, urgent care center, or emergency room.  If you have a medical emergency, please immediately call 911 or go to the emergency department.  Pager Numbers  - Dr. Kowalski: [phone]  - Dr. Moye: [phone]  - Dr. Stewart: [phone]  In the event of inclement weather, please call our main line at [phone] for an update on the status of any delays or closures.  Dermatology Medication Tips: Please keep the boxes that topical medications come in in order to help keep track of the instructions about where and how to use these. Pharmacies typically print the medication instructions only on the boxes and not directly on the medication tubes.   If your medication is too expensive, please contact our office at [phone] option 4 or send us a message through MyChart.   We are unable to tell what your co-pay for medications will be in advance as this is different depending on your insurance coverage. However, we may be able to find a substitute medication at lower cost or fill out paperwork to get insurance to cover a needed medication.   If a prior authorization is required to get your medication covered by your insurance company, please allow us 1-2 business days to complete this process.  Drug prices often vary depending on where the prescription is filled and some pharmacies may offer cheaper prices.  The  website www.goodrx.com contains coupons for medications through different pharmacies. The prices here do not account for what the cost may be with help from insurance (it may be cheaper with your insurance), but the website can give you the price if you did not use any insurance.  - You can print the associated coupon and take it with your prescription to the pharmacy.  - You may also stop by our office during regular business hours and pick up a GoodRx coupon card.  - If you need your prescription sent electronically to a different pharmacy, notify our office through [facility] MyChart or by phone at [phone] option 4.     Si Usted Necesita Algo Despus de Su Visita  Tambin puede enviarnos un mensaje a travs de MyChart. Por lo general respondemos a los mensajes de MyChart en el transcurso de 1 a 2 das hbiles.  Para renovar recetas, por favor   pida a su farmacia que se ponga en contacto con nuestra oficina. Nuestro nmero de fax es el [phone].  Si tiene un asunto urgente cuando la clnica est cerrada y que no puede esperar hasta el siguiente da hbil, puede llamar/localizar a su doctor(a) al nmero que aparece a continuacin.   Por favor, tenga en cuenta que aunque hacemos todo lo posible para estar disponibles para asuntos urgentes fuera del horario de oficina, no estamos disponibles las 24 horas del da, los 7 das de la semana.   Si tiene un problema urgente y no puede comunicarse con nosotros, puede optar por buscar atencin mdica  en el consultorio de su doctor(a), en una clnica privada, en un centro de atencin urgente o en una sala de emergencias.  Si tiene una emergencia mdica, por favor llame inmediatamente al 911 o vaya a la sala de emergencias.  Nmeros de bper  - Dr. Kowalski: [phone]  - Dra. Moye: [phone]  - Dra. Stewart: [phone]  En caso de inclemencias del tiempo, por favor llame a nuestra lnea principal al [phone] para una  actualizacin sobre el estado de cualquier retraso o cierre.  Consejos para la medicacin en dermatologa: Por favor, guarde las cajas en las que vienen los medicamentos de uso tpico para ayudarle a seguir las instrucciones sobre dnde y cmo usarlos. Las farmacias generalmente imprimen las instrucciones del medicamento slo en las cajas y no directamente en los tubos del medicamento.   Si su medicamento es muy caro, por favor, pngase en contacto con nuestra oficina llamando al [phone] y presione la opcin 4 o envenos un mensaje a travs de MyChart.   No podemos decirle cul ser su copago por los medicamentos por adelantado ya que esto es diferente dependiendo de la cobertura de su seguro. Sin embargo, es posible que podamos encontrar un medicamento sustituto a menor costo o llenar un formulario para que el seguro cubra el medicamento que se considera necesario.   Si se requiere una autorizacin previa para que su compaa de seguros cubra su medicamento, por favor permtanos de 1 a 2 das hbiles para completar este proceso.  Los precios de los medicamentos varan con frecuencia dependiendo del lugar de dnde se surte la receta y alguna farmacias pueden ofrecer precios ms baratos.  El sitio web www.goodrx.com tiene cupones para medicamentos de diferentes farmacias. Los precios aqu no tienen en cuenta lo que podra costar con la ayuda del seguro (puede ser ms barato con su seguro), pero el sitio web puede darle el precio si no utiliz ningn seguro.  - Puede imprimir el cupn correspondiente y llevarlo con su receta a la farmacia.  - Tambin puede pasar por nuestra oficina durante el horario de atencin regular y recoger una tarjeta de cupones de GoodRx.  - Si necesita que su receta se enve electrnicamente a una farmacia diferente, informe a nuestra oficina a travs de MyChart de [facility] o por telfono llamando al [phone] y presione la opcin 4.  

## 2021-09-15 NOTE — Progress Notes (Signed)
Follow-Up Visit   Subjective  Edward Garza is a 71 y.o. male who presents for the following: UBSE.  The patient presents for Upper Body Skin Exam (UBSE) for skin cancer screening and mole check.  The patient has spots, moles and lesions to be evaluated, some may be new or changing.  He has a rash on the forehead x years. Redness fades, but never goes away, sore. He was prescribed a cream for rosacea, but was too expensive. He tried several OTC creams, but nothing has helped.  He also has itchy spots on his back.   The following portions of the chart were reviewed this encounter and updated as appropriate:       Review of Systems:  No other skin or systemic complaints except as noted in HPI or Assessment and Plan.  Objective  Well appearing patient in no apparent distress; mood and affect are within normal limits.  All skin waist up examined.  Forehead Multiple inflammatory papules on the forehead  L preauricular x 1, L med zygoma x 1, R temple x 1, L forearm x 1,  L earlobe x 2, R upper lip x 1, L central upper lip x 1 (8) Pink scaly macules  Right paranasal 0.7 cm firm flesh white slightly pearly papule      back x 3 (3) Erythematous stuck-on, waxy papule   Right Ant Helix 2.5 mm dark gray blue macule     Mid Lower and upper Vermilion Lip Multiple hyperpigmented macules         Assessment & Plan  Skin cancer screening performed today.  Actinic Damage - chronic, secondary to cumulative UV radiation exposure/sun exposure over time - diffuse scaly erythematous macules with underlying dyspigmentation - Recommend daily broad spectrum sunscreen SPF 30+ to sun-exposed areas, reapply every 2 hours as needed.  - Recommend staying in the shade or wearing long sleeves, sun glasses (UVA+UVB protection) and wide brim hats (4-inch brim around the entire circumference of the hat). - Call for new or changing lesions.  Purpura - Chronic; persistent and recurrent.   Treatable, but not curable. - Violaceous macules and patches - Benign - Related to trauma, age, sun damage and/or use of blood thinners, chronic use of topical and/or oral steroids - Observe - Can use OTC arnica containing moisturizer such as Dermend Bruise Formula if desired - Call for worsening or other concerns  Lentigines - Scattered tan macules- Due to sun exposure - Benign-appering, observe - Recommend daily broad spectrum sunscreen SPF 30+ to sun-exposed areas, reapply every 2 hours as needed. - Call for any changes  Seborrheic Keratoses - Stuck-on, waxy, tan-brown papules and/or plaques  - Benign-appearing - Discussed benign etiology and prognosis. - Observe - Call for any changes  Rosacea Forehead  Chronic and persistent condition with duration or expected duration over one year. Condition is bothersome/symptomatic for patient. Currently flared.   Rosacea is a chronic progressive skin condition usually affecting the face of adults, causing redness and/or acne bumps. It is treatable but not curable. It sometimes affects the eyes (ocular rosacea) as well. It may respond to topical and/or systemic medication and can flare with stress, sun exposure, alcohol, exercise and some foods.  Daily application of broad spectrum spf 30+ sunscreen to face is recommended to reduce flares.  Unable to afford skin medicinals triple cream Start metronidazole 0.75% gel Apply to forehead qd/bid dsp 45g 5Rf.  metroNIDAZOLE (METROGEL) 0.75 % gel - Forehead Apply to forehead once to twice  daily for rosacea.  AK (actinic keratosis) (8) L preauricular x 1, L med zygoma x 1, R temple x 1, L forearm x 1,  L earlobe x 2, R upper lip x 1, L central upper lip x 1  Actinic keratoses are precancerous spots that appear secondary to cumulative UV radiation exposure/sun exposure over time. They are chronic with expected duration over 1 year. A portion of actinic keratoses will progress to squamous cell  carcinoma of the skin. It is not possible to reliably predict which spots will progress to skin cancer and so treatment is recommended to prevent development of skin cancer.  Recommend daily broad spectrum sunscreen SPF 30+ to sun-exposed areas, reapply every 2 hours as needed.  Recommend staying in the shade or wearing long sleeves, sun glasses (UVA+UVB protection) and wide brim hats (4-inch brim around the entire circumference of the hat). Call for new or changing lesions.  Destruction of lesion - L preauricular x 1, L med zygoma x 1, R temple x 1, L forearm x 1,  L earlobe x 2, R upper lip x 1, L central upper lip x 1  Destruction method: cryotherapy   Informed consent: discussed and consent obtained   Lesion destroyed using liquid nitrogen: Yes   Region frozen until ice ball extended beyond lesion: Yes   Outcome: patient tolerated procedure well with no complications   Post-procedure details: wound care instructions given   Additional details:  Prior to procedure, discussed risks of blister formation, small wound, skin dyspigmentation, or rare scar following cryotherapy. Recommend Vaseline ointment to treated areas while healing.   Neoplasm of uncertain behavior of skin Right paranasal  Skin / nail biopsy Type of biopsy: tangential   Informed consent: discussed and consent obtained   Patient was prepped and draped in usual sterile fashion: Area prepped with alcohol. Anesthesia: the lesion was anesthetized in a standard fashion   Anesthetic:  1% lidocaine w/ epinephrine 1-100,000 buffered w/ 8.4% NaHCO3 Instrument used: flexible razor blade   Hemostasis achieved with: pressure, aluminum chloride and electrodesiccation   Outcome: patient tolerated procedure well   Post-procedure details: wound care instructions given   Post-procedure details comment:  Ointment and small bandage applied  Specimen 1 - Surgical pathology Differential Diagnosis: Cyst vs Nevus r/o BCC Check Margins:  No 0.6 cm firm flesh white slightly pearly papule   If BCC, would need further treatment, discussed EDC, Excision, Mohs surgery  Inflamed seborrheic keratosis (3) back x 3  Symptomatic, irritating, patient would like treated.  Destruction of lesion - back x 3  Destruction method: cryotherapy   Informed consent: discussed and consent obtained   Lesion destroyed using liquid nitrogen: Yes   Region frozen until ice ball extended beyond lesion: Yes   Outcome: patient tolerated procedure well with no complications   Post-procedure details: wound care instructions given   Additional details:  Prior to procedure, discussed risks of blister formation, small wound, skin dyspigmentation, or rare scar following cryotherapy. Recommend Vaseline ointment to treated areas while healing.   Blue nevus Right Ant Helix  vs [place]  Benign appearing. Observation.   Lentigo Mid Lower and upper Vermilion Lip  Benign-appearing.  Observation.  Call clinic for new or changing lesions.  Recommend daily use of broad spectrum spf 30+ sunscreen to sun-exposed areas.    Recommend lip balm with sunscreen when outdoors    Return pending biopsy results.  IJamesetta Orleans, CMA, am acting as scribe for Brendolyn Patty, MD .  Documentation: I have reviewed the above documentation for accuracy and completeness, and I agree with the above.  Brendolyn Patty MD

## 2021-09-22 ENCOUNTER — Telehealth: Payer: Self-pay

## 2021-09-22 NOTE — Telephone Encounter (Signed)
-----   Message from Brendolyn Patty, MD sent at 09/22/2021  2:37 PM EDT ----- Skin , right paranasal BASAL CELL CARCINOMA, NODULAR PATTERN, BASE INVOLVED  BCC skin cancer, schedule for excision vrs EDC   - please call patient

## 2021-09-22 NOTE — Telephone Encounter (Signed)
Left message for patient to call for biopsy results. 

## 2021-09-23 ENCOUNTER — Encounter: Payer: Self-pay | Admitting: Dermatology

## 2021-09-23 ENCOUNTER — Telehealth: Payer: Self-pay

## 2021-09-23 ENCOUNTER — Other Ambulatory Visit: Payer: Self-pay

## 2021-09-23 DIAGNOSIS — C44310 Basal cell carcinoma of skin of unspecified parts of face: Secondary | ICD-10-CM

## 2021-09-23 NOTE — Telephone Encounter (Signed)
-----   Message from Brendolyn Patty, MD sent at 09/22/2021  2:37 PM EDT ----- Skin , right paranasal BASAL CELL CARCINOMA, NODULAR PATTERN, BASE INVOLVED  BCC skin cancer, schedule for excision vrs EDC   - please call patient

## 2021-09-23 NOTE — Telephone Encounter (Signed)
Patient returned call and we went over biopsy results and surgery is scheduled.

## 2021-10-20 ENCOUNTER — Encounter: Payer: Medicare HMO | Admitting: Dermatology

## 2021-11-25 ENCOUNTER — Telehealth: Payer: Self-pay

## 2021-11-25 NOTE — Telephone Encounter (Signed)
History and specimen tracking updated per MOHs progress note. 

## 2022-02-24 ENCOUNTER — Encounter: Payer: Self-pay | Admitting: Dermatology

## 2022-02-24 ENCOUNTER — Ambulatory Visit: Payer: Medicare HMO | Admitting: Dermatology

## 2022-02-24 VITALS — BP 123/71

## 2022-02-24 DIAGNOSIS — L814 Other melanin hyperpigmentation: Secondary | ICD-10-CM | POA: Diagnosis not present

## 2022-02-24 DIAGNOSIS — D2321 Other benign neoplasm of skin of right ear and external auricular canal: Secondary | ICD-10-CM

## 2022-02-24 DIAGNOSIS — Z85828 Personal history of other malignant neoplasm of skin: Secondary | ICD-10-CM

## 2022-02-24 DIAGNOSIS — L719 Rosacea, unspecified: Secondary | ICD-10-CM

## 2022-02-24 DIAGNOSIS — L820 Inflamed seborrheic keratosis: Secondary | ICD-10-CM

## 2022-02-24 DIAGNOSIS — L738 Other specified follicular disorders: Secondary | ICD-10-CM

## 2022-02-24 DIAGNOSIS — D239 Other benign neoplasm of skin, unspecified: Secondary | ICD-10-CM

## 2022-02-24 NOTE — Patient Instructions (Addendum)
Cryotherapy Aftercare  Wash gently with soap and water everyday.   Apply Vaseline and Band-Aid daily until healed.   Rosacea  What is rosacea? Rosacea (say: ro-zay-sha) is a common skin disease that usually begins as a trend of flushing or blushing easily.  As rosacea progresses, a persistent redness in the center of the face will develop and may gradually spread beyond the nose and cheeks to the forehead and chin.  In some cases, the ears, chest, and back could be affected.  Rosacea may appear as tiny blood vessels or small red bumps that occur in crops.  Frequently they can contain pus, and are called "pustules".  If the bumps do not contain pus, they are referred to as "papules".  Rarely, in prolonged, untreated cases of rosacea, the oil glands of the nose and cheeks may become permanently enlarged.  This is called rhinophyma, and is seen more frequently in men.  Signs and Risks In its beginning stages, rosacea tends to come and go, which makes it difficult to recognize.  It can start as intermittent flushing of the face.  Eventually, blood vessels may become permanently visible.  Pustules and papules can appear, but can be mistaken for adult acne.  People of all races, ages, genders and ethnic groups are at risk of developing rosacea.  However, it is more common in women (especially around menopause) and adults with fair skin between the ages of 30 and 50.  Treatment Dermatologists typically recommend a combination of treatments to effectively manage rosacea.  Treatment can improve symptoms and may stop the progression of the rosacea.  Treatment may involve both topical and oral medications.  The tetracycline antibiotics are often used for their anti-inflammatory effect; however, because of the possibility of developing antibiotic resistance, they should not be used long term at full dose.  For dilated blood vessels the options include electrodessication (uses electric current through a small  needle), laser treatment, and cosmetics to hide the redness.   With all forms of treatment, improvement is a slow process, and patients may not see any results for the first 3-4 weeks.  It is very important to avoid the sun and other triggers.  Patients must wear sunscreen daily.  Skin Care Instructions: Cleanse the skin with a mild soap such as CeraVe cleanser, Cetaphil cleanser, or Dove soap once or twice daily as needed. Moisturize with Eucerin Redness Relief Daily Perfecting Lotion (has a subtle green tint), CeraVe Moisturizing Cream, or Oil of Olay Daily Moisturizer with sunscreen every morning and/or night as recommended. Makeup should be "non-comedogenic" (won't clog pores) and be labeled "for sensitive skin". Good choices for cosmetics are: Neutrogena, Almay, and Physician's Formula.  Any product with a green tint tends to offset a red complexion. If your eyes are dry and irritated, use artificial tears 2-3 times per day and cleanse the eyelids daily with baby shampoo.  Have your eyes examined at least every 2 years.  Be sure to tell your eye doctor that you have rosacea. Alcoholic beverages tend to cause flushing of the skin, and may make rosacea worse. Always wear sunscreen, protect your skin from extreme hot and cold temperatures, and avoid spicy foods, hot drinks, and mechanical irritation such as rubbing, scrubbing, or massaging the face.  Avoid harsh skin cleansers, cleansing masks, astringents, and exfoliation. If a particular product burns or makes your face feel tight, then it is likely to flare your rosacea. If you are having difficulty finding a sunscreen that you can tolerate,   you may try switching to a chemical-free sunscreen.  These are ones whose active ingredient is zinc oxide or titanium dioxide only.  They should also be fragrance free, non-comedogenic, and labeled for sensitive skin. Rosacea triggers may vary from person to person.  There are a variety of foods that have been  reported to trigger rosacea.  Some patients find that keeping a diary of what they were doing when they flared helps them avoid triggers.   Due to recent changes in healthcare laws, you may see results of your pathology and/or laboratory studies on MyChart before the doctors have had a chance to review them. We understand that in some cases there may be results that are confusing or concerning to you. Please understand that not all results are received at the same time and often the doctors may need to interpret multiple results in order to provide you with the best plan of care or course of treatment. Therefore, we ask that you please give us 2 business days to thoroughly review all your results before contacting the office for clarification. Should we see a critical lab result, you will be contacted sooner.   If You Need Anything After Your Visit  If you have any questions or concerns for your doctor, please call our main line at [phone] and press option 4 to reach your doctor's medical assistant. If no one answers, please leave a voicemail as directed and we will return your call as soon as possible. Messages left after 4 pm will be answered the following business day.   You may also send us a message via MyChart. We typically respond to MyChart messages within 1-2 business days.  For prescription refills, please ask your pharmacy to contact our office. Our fax number is [phone].  If you have an urgent issue when the clinic is closed that cannot wait until the next business day, you can page your doctor at the number below.    Please note that while we do our best to be available for urgent issues outside of office hours, we are not available 24/7.   If you have an urgent issue and are unable to reach us, you may choose to seek medical care at your doctor's office, retail clinic, urgent care center, or emergency room.  If you have a medical emergency, please immediately call 911 or  go to the emergency department.  Pager Numbers  - Dr. Kowalski: [phone]  - Dr. Moye: [phone]  - Dr. Stewart: [phone]  In the event of inclement weather, please call our main line at [phone] for an update on the status of any delays or closures.  Dermatology Medication Tips: Please keep the boxes that topical medications come in in order to help keep track of the instructions about where and how to use these. Pharmacies typically print the medication instructions only on the boxes and not directly on the medication tubes.   If your medication is too expensive, please contact our office at [phone] option 4 or send us a message through MyChart.   We are unable to tell what your co-pay for medications will be in advance as this is different depending on your insurance coverage. However, we may be able to find a substitute medication at lower cost or fill out paperwork to get insurance to cover a needed medication.   If a prior authorization is required to get your medication covered by your insurance company, please allow us 1-2 business days to complete this process.    Drug prices often vary depending on where the prescription is filled and some pharmacies may offer cheaper prices.  The website www.goodrx.com contains coupons for medications through different pharmacies. The prices here do not account for what the cost may be with help from insurance (it may be cheaper with your insurance), but the website can give you the price if you did not use any insurance.  - You can print the associated coupon and take it with your prescription to the pharmacy.  - You may also stop by our office during regular business hours and pick up a GoodRx coupon card.  - If you need your prescription sent electronically to a different pharmacy, notify our office through [facility] MyChart or by phone at [phone] option 4.     Si Usted Necesita Algo Despus de Su Visita  Tambin  puede enviarnos un mensaje a travs de MyChart. Por lo general respondemos a los mensajes de MyChart en el transcurso de 1 a 2 das hbiles.  Para renovar recetas, por favor pida a su farmacia que se ponga en contacto con nuestra oficina. Nuestro nmero de fax es el [phone].  Si tiene un asunto urgente cuando la clnica est cerrada y que no puede esperar hasta el siguiente da hbil, puede llamar/localizar a su doctor(a) al nmero que aparece a continuacin.   Por favor, tenga en cuenta que aunque hacemos todo lo posible para estar disponibles para asuntos urgentes fuera del horario de oficina, no estamos disponibles las 24 horas del da, los 7 das de la semana.   Si tiene un problema urgente y no puede comunicarse con nosotros, puede optar por buscar atencin mdica  en el consultorio de su doctor(a), en una clnica privada, en un centro de atencin urgente o en una sala de emergencias.  Si tiene una emergencia mdica, por favor llame inmediatamente al 911 o vaya a la sala de emergencias.  Nmeros de bper  - Dr. Kowalski: [phone]  - Dra. Moye: [phone]  - Dra. Stewart: [phone]  En caso de inclemencias del tiempo, por favor llame a nuestra lnea principal al [phone] para una actualizacin sobre el estado de cualquier retraso o cierre.  Consejos para la medicacin en dermatologa: Por favor, guarde las cajas en las que vienen los medicamentos de uso tpico para ayudarle a seguir las instrucciones sobre dnde y cmo usarlos. Las farmacias generalmente imprimen las instrucciones del medicamento slo en las cajas y no directamente en los tubos del medicamento.   Si su medicamento es muy caro, por favor, pngase en contacto con nuestra oficina llamando al [phone] y presione la opcin 4 o envenos un mensaje a travs de MyChart.   No podemos decirle cul ser su copago por los medicamentos por adelantado ya que esto es diferente dependiendo de la cobertura de su  seguro. Sin embargo, es posible que podamos encontrar un medicamento sustituto a menor costo o llenar un formulario para que el seguro cubra el medicamento que se considera necesario.   Si se requiere una autorizacin previa para que su compaa de seguros cubra su medicamento, por favor permtanos de 1 a 2 das hbiles para completar este proceso.  Los precios de los medicamentos varan con frecuencia dependiendo del lugar de dnde se surte la receta y alguna farmacias pueden ofrecer precios ms baratos.  El sitio web www.goodrx.com tiene cupones para medicamentos de diferentes farmacias. Los precios aqu no tienen en cuenta lo que podra costar con la ayuda del seguro (puede ser ms   barato con su seguro), pero el sitio web puede darle el precio si no utiliz ningn seguro.  - Puede imprimir el cupn correspondiente y llevarlo con su receta a la farmacia.  - Tambin puede pasar por nuestra oficina durante el horario de atencin regular y recoger una tarjeta de cupones de GoodRx.  - Si necesita que su receta se enve electrnicamente a una farmacia diferente, informe a nuestra oficina a travs de MyChart de [facility] o por telfono llamando al [phone] y presione la opcin 4.  

## 2022-02-24 NOTE — Progress Notes (Signed)
Follow-Up Visit   Subjective  Edward Garza is a 72 y.o. male who presents for the following: Follow-up.  Patient presents for 6 month follow-up. History of BCC of the right paranasal, Mohs surgery 11/06/2021. He also has a history of AKs. He has a few itchy spots on his back he would like removed.  The following portions of the chart were reviewed this encounter and updated as appropriate:       Review of Systems:  No other skin or systemic complaints except as noted in HPI or Assessment and Plan.  Objective  Well appearing patient in no apparent distress; mood and affect are within normal limits.  A focused examination was performed including face. Relevant physical exam findings are noted in the Assessment and Plan.  right paranasal Well healed scar with no evidence of recurrence.   Right antihelix 2.5 mm dark gray blue macule   Mid Lower and upper Vermilion Lip Multiple hyperpigmented macules    left paranasal 3.0 mm pink flesh papule  Right Buccal Cheek Mid face erythema with telangiectasias   R spinal upper back x 3, L post flank x 1 (4) Pink scaly macules.    Assessment & Plan  History of basal cell carcinoma (BCC) right paranasal  Mohs surgery 11/06/2021. Clear. Observe for recurrence. Call clinic for new or changing lesions.  Recommend regular skin exams, daily broad-spectrum spf 30+ sunscreen use, and photoprotection.    Blue nevus Right antihelix  Benign-appearing, stable compared to photo.  Observation.  Call clinic for new or changing moles.  Recommend daily use of broad spectrum spf 30+ sunscreen to sun-exposed areas.    Lentigines Mid Lower and upper Vermilion Lip  Benign-appearing, stable compared to photos.  Observation.  Call clinic for new or changing lesions.  Recommend daily use of broad spectrum spf 30+ sunscreen to sun-exposed areas.     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  Benign appearing. Observation.   Rosacea Right Buccal Cheek  Chronic and persistent condition with duration or expected duration over one year. Condition is symptomatic / bothersome to patient. Not to goal, but improving.  Rosacea is a chronic progressive skin condition usually affecting the face of adults, causing redness and/or acne bumps. It is treatable but not curable. It sometimes affects the eyes (ocular rosacea) as well. It may respond to topical and/or systemic medication and can flare with stress, sun exposure, alcohol, exercise, topical steroids (including hydrocortisone/cortisone 10) and some foods.  Daily application of broad spectrum spf 30+ sunscreen to face is recommended to reduce flares.  Continue metronidazole 0.75% gel QD/BID for rosacea.   Related Medications metroNIDAZOLE (METROGEL) 0.75 % gel Apply to forehead once to twice daily for rosacea.  Inflamed seborrheic keratosis (4) R spinal upper back x 3, L post flank x 1  Symptomatic, irritating, patient would like treated.  Destruction of lesion - R spinal upper back x 3, L post flank x 1  Destruction method: cryotherapy   Informed consent: discussed and consent obtained   Lesion destroyed using liquid nitrogen: Yes   Region frozen until ice ball extended beyond lesion: Yes   Outcome: patient tolerated procedure well with no complications   Post-procedure details: wound care instructions given   Additional details:  Prior to procedure, discussed risks of blister formation, small wound, skin dyspigmentation, or rare scar following cryotherapy. Recommend Vaseline ointment to treated areas while healing.    Return  in about 6 months (around 08/25/2022) for UBSE, Hx BCC, Hx AKs.  IJamesetta Orleans, CMA, am acting as scribe for Brendolyn Patty, MD .  Documentation: I have reviewed the above documentation for accuracy and completeness, and I agree with the above.  Brendolyn Patty MD

## 2022-05-05 ENCOUNTER — Other Ambulatory Visit
Admission: RE | Admit: 2022-05-05 | Discharge: 2022-05-05 | Disposition: A | Discharge status: Home / Self Care | Payer: Medicare HMO | Attending: Urology | Admitting: Urology

## 2022-05-05 DIAGNOSIS — R972 Elevated prostate specific antigen [PSA]: Secondary | ICD-10-CM | POA: Insufficient documentation

## 2022-05-06 LAB — PSA, TOTAL AND FREE
PSA, Free Pct: 26.7 %
PSA, Free: 1.39 ng/mL
Prostate Specific Ag, Serum: 5.2 ng/mL — ABNORMAL HIGH (ref 0.0–4.0)

## 2022-05-12 ENCOUNTER — Encounter: Payer: Self-pay | Admitting: Urology

## 2022-05-12 ENCOUNTER — Ambulatory Visit (INDEPENDENT_AMBULATORY_CARE_PROVIDER_SITE_OTHER): Payer: Medicare HMO | Admitting: Urology

## 2022-05-12 VITALS — BP 146/82 | HR 69 | Ht 64.0 in | Wt 165.0 lb

## 2022-05-12 DIAGNOSIS — N521 Erectile dysfunction due to diseases classified elsewhere: Secondary | ICD-10-CM | POA: Diagnosis not present

## 2022-05-12 DIAGNOSIS — R972 Elevated prostate specific antigen [PSA]: Secondary | ICD-10-CM

## 2022-05-12 MED ORDER — TADALAFIL 5 MG PO TABS: ORAL_TABLET | ORAL | 11 refills | Status: DC

## 2022-05-12 NOTE — Patient Instructions (Signed)

## 2022-05-12 NOTE — Addendum Note (Signed)
Addended by: Sueanne Margarita on: 05/12/2022 09:45 AM   Modules accepted: Orders

## 2022-05-12 NOTE — Progress Notes (Signed)
   05/12/2022 9:37 AM   Edward Garza 08-24-1950 [id number]  Reason for visit: Follow up elevated PSA, ED  HPI: 72 year old healthy male who I originally saw in August 2022 for a mildly elevated PSA of 4.3.  DRE was normal and he opted for a repeat PSA in September 2022 which was stable at 4.4 with reassuring 21% free.  He opted to repeat the PSA in 6 months, and remained stable at 4.5 with reassuring 25% free.  We again reviewed at length the AUA guidelines regarding PSA screening and the risks and benefits.  PSA on 05/05/2022 is essentially stable at 5.2, reassuring 27% free.  Based on this, less than 10% chance of clinically significant prostate cancer.  We reviewed options including continuing PSA screening, discontinuing screening per guideline recommendations, prostate MRI, or prostate biopsy.  Risk and benefits discussed extensively, and using shared decision making he opted to continue yearly PSA monitoring.  We discussed the low, but not 0, risk of missing a clinically significant prostate cancer by deferring MRI or biopsy.  He is using Cialis 5 mg on demand for ED with good results.  Needs refill.  Cialis 5 to 10 mg as needed refilled RTC 1 year PSA reflex to free prior   Sondra Come, MD  [facility] [address] [place], [place] [address] [phone]

## 2022-08-17 ENCOUNTER — Ambulatory Visit: Payer: Medicare HMO | Admitting: Dermatology

## 2022-08-17 VITALS — BP 129/75

## 2022-08-17 DIAGNOSIS — W908XXA Exposure to other nonionizing radiation, initial encounter: Secondary | ICD-10-CM

## 2022-08-17 DIAGNOSIS — L570 Actinic keratosis: Secondary | ICD-10-CM

## 2022-08-17 DIAGNOSIS — L578 Other skin changes due to chronic exposure to nonionizing radiation: Secondary | ICD-10-CM

## 2022-08-17 DIAGNOSIS — D229 Melanocytic nevi, unspecified: Secondary | ICD-10-CM

## 2022-08-17 DIAGNOSIS — D2321 Other benign neoplasm of skin of right ear and external auricular canal: Secondary | ICD-10-CM

## 2022-08-17 DIAGNOSIS — L738 Other specified follicular disorders: Secondary | ICD-10-CM

## 2022-08-17 DIAGNOSIS — L814 Other melanin hyperpigmentation: Secondary | ICD-10-CM

## 2022-08-17 DIAGNOSIS — Z872 Personal history of diseases of the skin and subcutaneous tissue: Secondary | ICD-10-CM

## 2022-08-17 DIAGNOSIS — R238 Other skin changes: Secondary | ICD-10-CM

## 2022-08-17 DIAGNOSIS — L853 Xerosis cutis: Secondary | ICD-10-CM

## 2022-08-17 DIAGNOSIS — L719 Rosacea, unspecified: Secondary | ICD-10-CM

## 2022-08-17 DIAGNOSIS — D1801 Hemangioma of skin and subcutaneous tissue: Secondary | ICD-10-CM

## 2022-08-17 DIAGNOSIS — Z1283 Encounter for screening for malignant neoplasm of skin: Secondary | ICD-10-CM

## 2022-08-17 DIAGNOSIS — L821 Other seborrheic keratosis: Secondary | ICD-10-CM

## 2022-08-17 DIAGNOSIS — L820 Inflamed seborrheic keratosis: Secondary | ICD-10-CM | POA: Diagnosis not present

## 2022-08-17 DIAGNOSIS — Z85828 Personal history of other malignant neoplasm of skin: Secondary | ICD-10-CM

## 2022-08-17 NOTE — Patient Instructions (Addendum)
Cryotherapy Aftercare  Wash gently with soap and water everyday.   Apply Vaseline and Band-Aid daily until healed.    Recommend starting moisturizer with exfoliant (Urea, Salicylic acid, or Lactic acid) one to two times daily to help smooth rough and bumpy skin.  OTC options include Cetaphil Rough and Bumpy lotion (Urea), Eucerin Roughness Relief lotion or spot treatment cream (Urea), CeraVe SA lotion/cream for Rough and Bumpy skin (Sal Acid), Gold Bond Rough and Bumpy cream (Sal Acid), and AmLactin 12% lotion/cream (Lactic Acid).  If applying in morning, also apply sunscreen to sun-exposed areas, since these exfoliating moisturizers can increase sensitivity to sun.    Gentle Skin Care Guide  1. Bathe no more than once a day.  2. Avoid bathing in hot water  3. Use a mild soap like Dove, Vanicream, Cetaphil, CeraVe. Can use Lever 2000 or Cetaphil antibacterial soap  4. Use soap only where you need it. On most days, use it under your arms, between your legs, and on your feet. Let the water rinse other areas unless visibly dirty.  5. When you get out of the bath/shower, use a towel to gently blot your skin dry, don't rub it.  6. While your skin is still a little damp, apply a moisturizing cream such as Vanicream, CeraVe, Cetaphil, Eucerin, Sarna lotion or plain Vaseline Jelly. For hands apply Neutrogena [place] Hand Cream or Excipial Hand Cream.  7. Reapply moisturizer any time you start to itch or feel dry.  8. Sometimes using free and clear laundry detergents can be helpful. Fabric softener sheets should be avoided. Downy Free & Gentle liquid, or any liquid fabric softener that is free of dyes and perfumes, it acceptable to use  9. If your doctor has given you prescription creams you may apply moisturizers over them   Gentle Skin Care Guide  1. Bathe no more than once a day.  2. Avoid bathing in hot water  3. Use a mild soap like Dove, Vanicream, Cetaphil, CeraVe. Can use Lever  2000 or Cetaphil antibacterial soap  4. Use soap only where you need it. On most days, use it under your arms, between your legs, and on your feet. Let the water rinse other areas unless visibly dirty.  5. When you get out of the bath/shower, use a towel to gently blot your skin dry, don't rub it.  6. While your skin is still a little damp, apply a moisturizing cream such as Vanicream, CeraVe, Cetaphil, Eucerin, Sarna lotion or plain Vaseline Jelly. For hands apply Neutrogena [place] Hand Cream or Excipial Hand Cream.  7. Reapply moisturizer any time you start to itch or feel dry.  8. Sometimes using free and clear laundry detergents can be helpful. Fabric softener sheets should be avoided. Downy Free & Gentle liquid, or any liquid fabric softener that is free of dyes and perfumes, it acceptable to use  9. If your doctor has given you prescription creams you may apply moisturizers over them      Due to recent changes in healthcare laws, you may see results of your pathology and/or laboratory studies on MyChart before the doctors have had a chance to review them. We understand that in some cases there may be results that are confusing or concerning to you. Please understand that not all results are received at the same time and often the doctors may need to interpret multiple results in order to provide you with the best plan of care or course of treatment. Therefore, we  ask that you please give [place] 2 business days to thoroughly review all your results before contacting the office for clarification. Should we see a critical lab result, you will be contacted sooner.   If You Need Anything After Your Visit  If you have any questions or concerns for your doctor, please call our main line at [phone] and press option 4 to reach your doctor's medical assistant. If no one answers, please leave a voicemail as directed and we will return your call as soon as possible. Messages left after 4 pm will  be answered the following business day.   You may also send [place] a message via MyChart. We typically respond to MyChart messages within 1-2 business days.  For prescription refills, please ask your pharmacy to contact our office. Our fax number is [phone].  If you have an urgent issue when the clinic is closed that cannot wait until the next business day, you can page your doctor at the number below.    Please note that while we do our best to be available for urgent issues outside of office hours, we are not available 24/7.   If you have an urgent issue and are unable to reach [place], you may choose to seek medical care at your doctor's office, retail clinic, urgent care center, or emergency room.  If you have a medical emergency, please immediately call 911 or go to the emergency department.  Pager Numbers  - Dr. Gwen Pounds: [phone]  - Dr. Neale Burly: [phone]  - Dr. Roseanne Reno: [phone]  In the event of inclement weather, please call our main line at [phone] for an update on the status of any delays or closures.  Dermatology Medication Tips: Please keep the boxes that topical medications come in in order to help keep track of the instructions about where and how to use these. Pharmacies typically print the medication instructions only on the boxes and not directly on the medication tubes.   If your medication is too expensive, please contact our office at [phone] option 4 or send [place] a message through MyChart.   We are unable to tell what your co-pay for medications will be in advance as this is different depending on your insurance coverage. However, we may be able to find a substitute medication at lower cost or fill out paperwork to get insurance to cover a needed medication.   If a prior authorization is required to get your medication covered by your insurance company, please allow [place] 1-2 business days to complete this process.  Drug prices often vary depending on  where the prescription is filled and some pharmacies may offer cheaper prices.  The website www.goodrx.com contains coupons for medications through different pharmacies. The prices here do not account for what the cost may be with help from insurance (it may be cheaper with your insurance), but the website can give you the price if you did not use any insurance.  - You can print the associated coupon and take it with your prescription to the pharmacy.  - You may also stop by our office during regular business hours and pick up a GoodRx coupon card.  - If you need your prescription sent electronically to a different pharmacy, notify our office through [facility] or by phone at [phone] option 4.     Si Usted Necesita Algo Despus de Su Visita  Tambin puede enviarnos un mensaje a travs de Clinical cytogeneticist. Por lo general respondemos a los Multimedia programmer en  el transcurso de 1 a 2 das hbiles.  Para renovar recetas, por favor pida a su farmacia que se ponga en contacto con nuestra oficina. Annie Sable de fax es [place] [phone].  Si tiene un asunto urgente cuando la clnica est cerrada y que no puede esperar hasta el siguiente da hbil, puede llamar/localizar a su doctor(a) al nmero que aparece a continuacin.   Por favor, tenga en cuenta que aunque hacemos todo lo posible para estar disponibles para asuntos urgentes fuera del horario de [place], no estamos disponibles las 24 horas del da, los 7 [address]  [address] de la [place].   Si tiene un problema urgente y no puede comunicarse con nosotros, puede optar por buscar atencin mdica  en el consultorio de su doctor(a), en una clnica privada, en un centro de atencin urgente o en una sala de emergencias.  Si tiene Engineer, drilling, por favor llame inmediatamente al 911 o vaya a la sala de emergencias.  Nmeros de bper  - Dr. Gwen Pounds: [phone]  - Dra. Moye: [phone]  - Dra. Roseanne Reno: [phone]  En caso de inclemencias  del [place], por favor llame a Lacy Duverney principal al [phone] para una actualizacin sobre el [place] de cualquier retraso o cierre.  Consejos para la medicacin en dermatologa: Por favor, guarde las cajas en las que vienen los medicamentos de uso tpico para ayudarle a seguir las instrucciones sobre dnde y cmo usarlos. Las farmacias generalmente imprimen las instrucciones del medicamento slo en las cajas y no directamente en los tubos del [place].   Si su medicamento es muy caro, por favor, pngase en contacto con Rolm Gala llamando al [phone] y presione la opcin 4 o envenos un mensaje a travs de Clinical cytogeneticist.   No podemos decirle cul ser su copago por los medicamentos por adelantado ya que esto es diferente dependiendo de la cobertura de su seguro. Sin embargo, es posible que podamos encontrar un medicamento sustituto a Audiological scientist un formulario para que el seguro cubra el medicamento que se considera necesario.   Si se requiere una autorizacin previa para que su compaa de seguros [place] su medicamento, por favor permtanos de 1 a 2 das hbiles para completar [address]  Los precios de los medicamentos varan con frecuencia dependiendo del Environmental consultant de dnde se surte la receta y alguna farmacias pueden ofrecer precios ms baratos.  El sitio web www.goodrx.com tiene cupones para medicamentos de Health and safety inspector. Los precios aqu no tienen en cuenta lo que podra costar con la ayuda del seguro (puede ser ms barato con su seguro), pero el sitio web puede darle el precio si no utiliz Tourist information centre manager.  - Puede imprimir el cupn correspondiente y llevarlo con su receta a la farmacia.  - Tambin puede pasar por nuestra oficina durante el horario de atencin regular y Education officer, museum una tarjeta de cupones de GoodRx.  - Si necesita que su receta se enve electrnicamente a una farmacia diferente, informe a nuestra oficina a travs de MyChart de [place] o por telfono  llamando al [phone] y presione la opcin 4.

## 2022-08-17 NOTE — Progress Notes (Signed)
Follow-Up Visit   Subjective  Edward Garza is a 72 y.o. male who presents for the following: Skin Cancer Screening and Upper Body Skin Exam, hx of BCC, Aks, check spots back, R leg, Rosacea face, Metronidazole prn flares.  Spot on shoulder gets itchy.  The patient presents for Upper Body Skin Exam (UBSE) for skin cancer screening and mole check. The patient has spots, moles and lesions to be evaluated, some may be new or changing and the patient may have concern these could be cancer.    The following portions of the chart were reviewed this encounter and updated as appropriate: medications, allergies, medical history  Review of Systems:  No other skin or systemic complaints except as noted in HPI or Assessment and Plan.  Objective  Well appearing patient in no apparent distress; mood and affect are within normal limits.  All skin waist up examined. Relevant physical exam findings are noted in the Assessment and Plan.  R post shoulder x 1 Stuck on waxy paps with erythema  L hand dorsum x 1, R wrist x 1, R forearm x 2, R lat lower leg x 1 (5) Keratotic macules    Assessment & Plan   Inflamed seborrheic keratosis R post shoulder x 1  Symptomatic, irritating, patient would like treated.   Destruction of lesion - R post shoulder x 1  Destruction method: cryotherapy   Informed consent: discussed and consent obtained   Lesion destroyed using liquid nitrogen: Yes   Region frozen until ice ball extended beyond lesion: Yes   Outcome: patient tolerated procedure well with no complications   Post-procedure details: wound care instructions given   Additional details:  Prior to procedure, discussed risks of blister formation, small wound, skin dyspigmentation, or rare scar following cryotherapy. Recommend Vaseline ointment to treated areas while healing.   Hypertrophic actinic keratosis (5) L hand dorsum x 1, R wrist x 1, R forearm x 2, R lat lower leg x 1  Vs ISK  Actinic  keratoses are precancerous spots that appear secondary to cumulative UV radiation exposure/sun exposure over time. They are chronic with expected duration over 1 year. A portion of actinic keratoses will progress to squamous cell carcinoma of the skin. It is not possible to reliably predict which spots will progress to skin cancer and so treatment is recommended to prevent development of skin cancer.  Recommend daily broad spectrum sunscreen SPF 30+ to sun-exposed areas, reapply every 2 hours as needed.  Recommend staying in the shade or wearing long sleeves, sun glasses (UVA+UVB protection) and wide brim hats (4-inch brim around the entire circumference of the hat). Call for new or changing lesions.     Destruction of lesion - L hand dorsum x 1, R wrist x 1, R forearm x 2, R lat lower leg x 1 (5)  Destruction method: cryotherapy   Informed consent: discussed and consent obtained   Lesion destroyed using liquid nitrogen: Yes   Region frozen until ice ball extended beyond lesion: Yes   Outcome: patient tolerated procedure well with no complications   Post-procedure details: wound care instructions given   Additional details:  Prior to procedure, discussed risks of blister formation, small wound, skin dyspigmentation, or rare scar following cryotherapy. Recommend Vaseline ointment to treated areas while healing.    ACTINIC DAMAGE - chronic, secondary to cumulative UV radiation exposure/sun exposure over time - diffuse scaly erythematous macules with underlying dyspigmentation - Recommend daily broad spectrum sunscreen SPF 30+ to sun-exposed areas,  reapply every 2 hours as needed.  - Recommend staying in the shade or wearing long sleeves, sun glasses (UVA+UVB protection) and wide brim hats (4-inch brim around the entire circumference of the hat). - Call for new or changing lesions.   Skin cancer screening performed today.  Lentigines, Seborrheic Keratoses, Hemangiomas - Benign normal skin  lesions - Benign-appearing - Call for any changes  Melanocytic Nevi - Tan-brown and/or pink-flesh-colored symmetric macules and papules - Benign appearing on exam today - Observation - Call clinic for new or changing moles - Recommend daily use of broad spectrum spf 30+ sunscreen to sun-exposed areas.   BLUE NEVUS R anterior helix Exam: 2.59mm dark gray blue macule  Treatment Plan: Benign-appearing. Stable compared to previous visit. Observation.  Call clinic for new or changing moles.  Recommend daily use of broad spectrum spf 30+ sunscreen to sun-exposed areas.      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 Stable compared to previous visit. Observation.  Call clinic for new or changing moles.  Recommend daily use of broad spectrum spf 30+ sunscreen to sun-exposed areas.       HISTORY OF BASAL CELL CARCINOMA OF THE SKIN - No evidence of recurrence today - Recommend regular full body skin exams - Recommend daily broad spectrum sunscreen SPF 30+ to sun-exposed areas, reapply every 2 hours as needed.  - Call if any new or changing lesions are noted between office visits  - R paranasal  SEBORRHEIC KERATOSIS - Stuck-on, waxy, tan-brown papules and/or plaques  - Benign-appearing - Discussed benign etiology and prognosis. - Observe - Call for any changes - R lower leg, L lower leg  Xerosis - diffuse xerotic patches - recommend gentle, hydrating skin care - gentle skin care handout given   ROSACEA Exam Mid face erythema with telangiectasias   Chronic condition with duration or expected duration over one year. Currently well-controlled.   Rosacea is a chronic progressive skin condition usually affecting the face of adults, causing redness and/or acne bumps. It is treatable but not curable. It sometimes affects the eyes (ocular rosacea) as well. It may respond to topical and/or systemic medication and can flare  with stress, sun exposure, alcohol, exercise, topical steroids (including hydrocortisone/cortisone 10) and some foods.  Daily application of broad spectrum spf 30+ sunscreen to face is recommended to reduce flares.  Patient denies grittiness of the eyes  Treatment Plan Continue metronidazole gel every day prn  Return in about 1 year (around 08/17/2023) for UBSE, Hx of BCC, Hx of AKs.  I, Ardis Rowan, RMA, am acting as scribe for Willeen Niece, MD .   Documentation: I have reviewed the above documentation for accuracy and completeness, and I agree with the above.  Willeen Niece, MD

## 2023-04-30 ENCOUNTER — Telehealth: Payer: Self-pay | Admitting: Urology

## 2023-04-30 NOTE — Telephone Encounter (Signed)
 Left message for patient and his wife letting them know that the order is placed already for [facility] and if they have any other questions to call back

## 2023-04-30 NOTE — Telephone Encounter (Signed)
 Pt wife called asking about his labs that he needs to do before his appt. With Dr. Richardo Hanks in [place]. She wants to make sure orders are put in before they drive up there to get them done.

## 2023-05-04 ENCOUNTER — Other Ambulatory Visit
Admission: RE | Admit: 2023-05-04 | Discharge: 2023-05-04 | Disposition: A | Discharge status: Home / Self Care | Attending: Urology | Admitting: Urology

## 2023-05-04 DIAGNOSIS — N521 Erectile dysfunction due to diseases classified elsewhere: Secondary | ICD-10-CM | POA: Insufficient documentation

## 2023-05-04 DIAGNOSIS — R972 Elevated prostate specific antigen [PSA]: Secondary | ICD-10-CM | POA: Diagnosis present

## 2023-05-05 LAB — FPSA% REFLEX
% FREE PSA: 26.1 %
PSA, FREE: 1.46 ng/mL

## 2023-05-05 LAB — PSA (REFLEX TO FREE) (SERIAL): Prostate Specific Ag, Serum: 5.6 ng/mL — ABNORMAL HIGH (ref 0.0–4.0)

## 2023-05-11 ENCOUNTER — Ambulatory Visit: Payer: Self-pay | Admitting: Urology

## 2023-05-11 VITALS — BP 153/86 | HR 77 | Wt 164.4 lb

## 2023-05-11 DIAGNOSIS — N521 Erectile dysfunction due to diseases classified elsewhere: Secondary | ICD-10-CM

## 2023-05-11 DIAGNOSIS — R972 Elevated prostate specific antigen [PSA]: Secondary | ICD-10-CM | POA: Diagnosis not present

## 2023-05-11 MED ORDER — TADALAFIL 5 MG PO TABS
ORAL_TABLET | ORAL | 11 refills | Status: AC
Start: 2023-05-11 — End: ?

## 2023-05-11 NOTE — Progress Notes (Signed)
   05/11/2023 9:13 AM   Edward Garza 04-25-1950 [id number]  Reason for visit: Follow up elevated PSA, ED  HPI: 73 year old healthy male who I originally saw in August 2022 for a mildly elevated PSA of 4.3.  DRE was normal and he opted for a repeat PSA in September 2022 which was stable at 4.4 with reassuring 21% free.  He opted to repeat the PSA in 6 months, and remained stable at 4.5 with reassuring 25% free.  We again reviewed at length the AUA guidelines regarding PSA screening and the risks and benefits.   PSA on 05/04/2023 essentially stable at 5.6 from 5.2, reassuring 26% free.  Based on this, less than 10% chance of clinically significant prostate cancer.  We reviewed options including continuing PSA screening, discontinuing screening per guideline recommendations, prostate MRI, or prostate biopsy.  Risk and benefits discussed extensively, and using shared decision making he opted to continue yearly PSA monitoring.  We discussed the low, but not 0, risk of missing a clinically significant prostate cancer by deferring MRI or biopsy.   He is using Cialis 5 mg on demand for ED with good results.  Needs refill.   Cialis 5 to 10 mg as needed refilled RTC 1 year PHI score prior-if reassuring consider discontinuing screening around age 50  Sondra Come, MD  [facility] [address] [place], [place] [address] [phone]

## 2023-05-11 NOTE — Patient Instructions (Signed)
 Prostate Cancer Screening  Prostate cancer screening is testing that is done to check for the presence of prostate cancer in men. The prostate gland is a walnut-sized gland that is located below the bladder and in front of the rectum in males. The function of the prostate is to add fluid to semen during ejaculation. Prostate cancer is one of the most common types of cancer in men. Who should have prostate cancer screening? Screening recommendations vary based on age and other risk factors, as well as between the professional organizations who make the recommendations. In general, screening is recommended if: You are age 59 to 46 and have an average risk for prostate cancer. You should talk with your health care provider about your need for screening and how often screening should be done. Because most prostate cancers are slow growing and will not cause death, screening in this age group is generally reserved for men who have a 10- to 15-year life expectancy. You are younger than age 64, and you have these risk factors: Having a father, brother, or uncle who has been diagnosed with prostate cancer. The risk is higher if your family member's cancer occurred at an early age or if you have multiple family members with prostate cancer at an early age. Being a male who is [place] or is of [place] or sub-Saharan African descent. In general, screening is not recommended if: You are younger than age 71. You are between the ages of 93 and 30 and you have no risk factors. You are 74 years of age or older. At this age, the risks that screening can cause are greater than the benefits that it may provide. If you are at high risk for prostate cancer, your health care provider may recommend that you have screenings more often or that you start screening at a younger age. How is screening for prostate cancer done? The recommended prostate cancer screening test is a blood test called the prostate-specific antigen  (PSA) test. PSA is a protein that is made in the prostate. As you age, your prostate naturally produces more PSA. Abnormally high PSA levels may be caused by: Prostate cancer. An enlarged prostate that is not caused by cancer (benign prostatic hyperplasia, or BPH). This condition is very common in older men. A prostate gland infection (prostatitis) or urinary tract infection. Certain medicines such as male hormones (like testosterone) or other medicines that raise testosterone levels. A rectal exam may be done as part of prostate cancer screening to help provide information about the size of your prostate gland. When a rectal exam is performed, it should be done after the PSA level is drawn to avoid any effect on the results. Depending on the PSA results, you may need more tests, such as: A physical exam to check the size of your prostate gland, if not done as part of screening. Blood and imaging tests. A procedure to remove tissue samples from your prostate gland for testing (biopsy). This is the only way to know for certain if you have prostate cancer. What are the benefits of prostate cancer screening? Screening can help to identify cancer at an early stage, before symptoms start and when the cancer can be treated more easily. There is a small chance that screening may lower your risk of dying from prostate cancer. The chance is small because prostate cancer is a slow-growing cancer, and most men with prostate cancer die from a different cause. What are the risks of prostate cancer screening? The  main risk of prostate cancer screening is diagnosing and treating prostate cancer that would never have caused any symptoms or problems. This is called overdiagnosisand overtreatment. PSA screening cannot tell you if your PSA is high due to cancer or a different cause. A prostate biopsy is the only procedure to diagnose prostate cancer. Even the results of a biopsy may not tell you if your cancer needs to  be treated. Slow-growing prostate cancer may not need any treatment other than monitoring, so diagnosing and treating it may cause unnecessary stress or other side effects. Questions to ask your health care provider When should I start prostate cancer screening? What is my risk for prostate cancer? How often do I need screening? What type of screening tests do I need? How do I get my test results? What do my results mean? Do I need treatment? Where to find more information The American Cancer Society: www.cancer.org American Urological Association: www.auanet.org Contact a health care provider if: You have difficulty urinating. You have pain when you urinate or ejaculate. You have blood in your urine or semen. You have pain in your back or in the area of your prostate. Summary Prostate cancer is a common type of cancer in men. The prostate gland is located below the bladder and in front of the rectum. This gland adds fluid to semen during ejaculation. Prostate cancer screening may identify cancer at an early stage, when the cancer can be treated more easily and is less likely to have spread to other areas of the body. The prostate-specific antigen (PSA) test is the recommended screening test for prostate cancer, but it has associated risks. Discuss the risks and benefits of prostate cancer screening with your health care provider. If you are age 73 or older, the risks that screening can cause are greater than the benefits that it may provide. This information is not intended to replace advice given to you by your health care provider. Make sure you discuss any questions you have with your health care provider. Document Revised: 07/15/2020 Document Reviewed: 07/15/2020 Elsevier Patient Education  2024 [employer].

## 2023-09-07 ENCOUNTER — Encounter: Payer: Medicare HMO | Admitting: Dermatology

## 2023-09-09 ENCOUNTER — Encounter: Admitting: Dermatology

## 2023-09-15 ENCOUNTER — Ambulatory Visit: Admitting: Dermatology

## 2023-09-15 DIAGNOSIS — D1801 Hemangioma of skin and subcutaneous tissue: Secondary | ICD-10-CM

## 2023-09-15 DIAGNOSIS — W908XXA Exposure to other nonionizing radiation, initial encounter: Secondary | ICD-10-CM

## 2023-09-15 DIAGNOSIS — L570 Actinic keratosis: Secondary | ICD-10-CM

## 2023-09-15 DIAGNOSIS — L578 Other skin changes due to chronic exposure to nonionizing radiation: Secondary | ICD-10-CM

## 2023-09-15 DIAGNOSIS — D2321 Other benign neoplasm of skin of right ear and external auricular canal: Secondary | ICD-10-CM

## 2023-09-15 DIAGNOSIS — Z1283 Encounter for screening for malignant neoplasm of skin: Secondary | ICD-10-CM

## 2023-09-15 DIAGNOSIS — L719 Rosacea, unspecified: Secondary | ICD-10-CM

## 2023-09-15 DIAGNOSIS — T148XXA Other injury of unspecified body region, initial encounter: Secondary | ICD-10-CM

## 2023-09-15 DIAGNOSIS — L821 Other seborrheic keratosis: Secondary | ICD-10-CM

## 2023-09-15 DIAGNOSIS — L853 Xerosis cutis: Secondary | ICD-10-CM

## 2023-09-15 DIAGNOSIS — L814 Other melanin hyperpigmentation: Secondary | ICD-10-CM | POA: Diagnosis not present

## 2023-09-15 DIAGNOSIS — D692 Other nonthrombocytopenic purpura: Secondary | ICD-10-CM

## 2023-09-15 DIAGNOSIS — D229 Melanocytic nevi, unspecified: Secondary | ICD-10-CM

## 2023-09-15 DIAGNOSIS — Z85828 Personal history of other malignant neoplasm of skin: Secondary | ICD-10-CM

## 2023-09-15 NOTE — Progress Notes (Signed)
 Follow-Up Visit   Subjective  Edward Garza is a 73 y.o. male who presents for the following: Skin Cancer Screening and Upper Body Skin Exam. Hx of BCC, Aks, and rosacea uses metronidazole  prn flares every few months. Place at left back that he would like checked.   The patient presents for Upper Body Skin Exam (UBSE) for skin cancer screening and mole check. The patient has spots, moles and lesions to be evaluated, some may be new or changing and the patient may have concern these could be cancer.   The following portions of the chart were reviewed this encounter and updated as appropriate: medications, allergies, medical history  Review of Systems:  No other skin or systemic complaints except as noted in HPI or Assessment and Plan.  Objective  Well appearing patient in no apparent distress; mood and affect are within normal limits.  All skin waist up examined. Relevant physical exam findings are noted in the Assessment and Plan.  R medial shoulder x1, R ear helix x1 (2) Pink scaly macules L hand dorsum x1, R forearm x1 R hand dorsum x1 (3) Pink scaly macules  Assessment & Plan   AK (ACTINIC KERATOSIS) (2) R medial shoulder x1, R ear helix x1 (2) Actinic keratoses are precancerous spots that appear secondary to cumulative UV radiation exposure/sun exposure over time. They are chronic with expected duration over 1 year. A portion of actinic keratoses will progress to squamous cell carcinoma of the skin. It is not possible to reliably predict which spots will progress to skin cancer and so treatment is recommended to prevent development of skin cancer.  Recommend daily broad spectrum sunscreen SPF 30+ to sun-exposed areas, reapply every 2 hours as needed.  Recommend staying in the shade or wearing long sleeves, sun glasses (UVA+UVB protection) and wide brim hats (4-inch brim around the entire circumference of the hat). Call for new or changing lesions. Destruction of lesion - R  medial shoulder x1, R ear helix x1 (2)  Destruction method: cryotherapy   Informed consent: discussed and consent obtained   Lesion destroyed using liquid nitrogen: Yes   Region frozen until ice ball extended beyond lesion: Yes   Outcome: patient tolerated procedure well with no complications   Post-procedure details: wound care instructions given   Additional details:  Prior to procedure, discussed risks of blister formation, small wound, skin dyspigmentation, or rare scar following cryotherapy. Recommend Vaseline ointment to treated areas while healing.   HYPERTROPHIC ACTINIC KERATOSIS (3) L hand dorsum x1, R forearm x1 R hand dorsum x1 (3) Actinic keratoses are precancerous spots that appear secondary to cumulative UV radiation exposure/sun exposure over time. They are chronic with expected duration over 1 year. A portion of actinic keratoses will progress to squamous cell carcinoma of the skin. It is not possible to reliably predict which spots will progress to skin cancer and so treatment is recommended to prevent development of skin cancer.  Recommend daily broad spectrum sunscreen SPF 30+ to sun-exposed areas, reapply every 2 hours as needed.  Recommend staying in the shade or wearing long sleeves, sun glasses (UVA+UVB protection) and wide brim hats (4-inch brim around the entire circumference of the hat). Call for new or changing lesions.  Destruction of lesion - L hand dorsum x1, R forearm x1 R hand dorsum x1 (3)  Destruction method: cryotherapy   Informed consent: discussed and consent obtained   Lesion destroyed using liquid nitrogen: Yes   Region frozen until ice ball extended beyond  lesion: Yes   Outcome: patient tolerated procedure well with no complications   Post-procedure details: wound care instructions given   Additional details:  Prior to procedure, discussed risks of blister formation, small wound, skin dyspigmentation, or rare scar following cryotherapy. Recommend  Vaseline ointment to treated areas while healing.    Skin cancer screening performed today.  Actinic Damage - Chronic condition, secondary to cumulative UV/sun exposure - diffuse scaly erythematous macules with underlying dyspigmentation - Recommend daily broad spectrum sunscreen SPF 30+ to sun-exposed areas, reapply every 2 hours as needed.  - Staying in the shade or wearing long sleeves, sun glasses (UVA+UVB protection) and wide brim hats (4-inch brim around the entire circumference of the hat) are also recommended for sun protection.  - Call for new or changing lesions.  Lentigines, Seborrheic Keratoses, Hemangiomas - Benign normal skin lesions - Benign-appearing - Call for any changes  Melanocytic Nevi - Tan-brown and/or pink-flesh-colored symmetric macules and papules - Benign appearing on exam today - Observation - Call clinic for new or changing moles - Recommend daily use of broad spectrum spf 30+ sunscreen to sun-exposed areas.   SEBORRHEIC KERATOSIS - Stuck-on, waxy, tan-brown papules and/or plaques at L mid back, left calf - Benign-appearing - Discussed benign etiology and prognosis. - Observe - Call for any changes  LENTIGINES Exam: 4 x 2 mm gray brown macule at L posterior neck  Due to sun exposure Treatment Plan: Benign-appearing, observe. Recommend daily broad spectrum sunscreen SPF 30+ to sun-exposed areas, reapply every 2 hours as needed.  Call for any changes  BLUE NEVUS R anterior helix Exam: 2.5 mm dark gray blue macule   Treatment Plan: Benign-appearing. Stable compared to previous visit. Observation.  Call clinic for new or changing moles.  Recommend daily use of broad spectrum spf 30+ sunscreen to sun-exposed areas.        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  - Call if any new or changing lesions are noted between office visits  Xerosis - diffuse xerotic patches - recommend gentle, hydrating skin care - gentle skin care handout given  ROSACEA Exam Mid face erythema with telangiectasias   Chronic condition with duration or expected duration over one year. Currently well-controlled.   Rosacea is a chronic progressive skin condition usually affecting the face of adults, causing redness and/or acne bumps. It is treatable but not curable. It sometimes affects the eyes (ocular rosacea) as well. It may respond to topical and/or systemic medication and can flare with stress, sun exposure, alcohol, exercise, topical steroids (including hydrocortisone/cortisone 10) and some foods.  Daily application of broad spectrum spf 30+ sunscreen to face is recommended to reduce flares.  Patient denies grittiness of the eyes  Treatment Plan Continue metronidazole  gel every day prn   Purpura - Chronic; persistent and recurrent.  Treatable, but not curable. - Violaceous macules and patches - Benign - Related to trauma, age, sun damage and/or use of blood thinners, chronic use of topical and/or oral steroids - Observe - Can use OTC arnica containing moisturizer such as Dermend Bruise Formula if desired - Call for worsening or other concerns  HEALING EXCORIATION Exam: Excoriation at left forearm, left zygoma   Treatment Plan: Recommend vaseline. Call if not resolving.    Return in about  1 year (around 09/14/2024) for UBSE, w/ Dr. Jackquline, [facility].  I, Jacquelynn V. Wilfred, CMA, am acting as scribe for Rexene Jackquline, MD .   Documentation: I have reviewed the above documentation for accuracy and completeness, and I agree with the above.  Rexene Jackquline, MD

## 2023-09-15 NOTE — Patient Instructions (Addendum)
 Cryotherapy Aftercare  Wash gently with soap and water everyday.   Apply Vaseline and Band-Aid daily until healed.   Recommend daily broad spectrum sunscreen SPF 30+ to sun-exposed areas, reapply every 2 hours as needed. Call for new or changing lesions.  Staying in the shade or wearing long sleeves, sun glasses (UVA+UVB protection) and wide brim hats (4-inch brim around the entire circumference of the hat) are also recommended for sun protection.    Melanoma ABCDEs  Melanoma is the most dangerous type of skin cancer, and is the leading cause of death from skin disease.  You are more likely to develop melanoma if you: Have light-colored skin, light-colored eyes, or red or blond hair Spend a lot of time in the sun Tan regularly, either outdoors or in a tanning bed Have had blistering sunburns, especially during childhood Have a close family member who has had a melanoma Have atypical moles or large birthmarks  Early detection of melanoma is key since treatment is typically straightforward and cure rates are extremely high if we catch it early.   The first sign of melanoma is often a change in a mole or a new dark spot.  The ABCDE system is a way of remembering the signs of melanoma.  A for asymmetry:  The two halves do not match. B for border:  The edges of the growth are irregular. C for color:  A mixture of colors are present instead of an even brown color. D for diameter:  Melanomas are usually (but not always) greater than 6mm - the size of a pencil eraser. E for evolution:  The spot keeps changing in size, shape, and color.  Please check your skin once per month between visits. You can use a small mirror in front and a large mirror behind you to keep an eye on the back side or your body.   If you see any new or changing lesions before your next follow-up, please call to schedule a visit.  Please continue daily skin protection including broad spectrum sunscreen SPF 30+ to  sun-exposed areas, reapplying every 2 hours as needed when you're outdoors.   Staying in the shade or wearing long sleeves, sun glasses (UVA+UVB protection) and wide brim hats (4-inch brim around the entire circumference of the hat) are also recommended for sun protection.     Due to recent changes in healthcare laws, you may see results of your pathology and/or laboratory studies on MyChart before the doctors have had a chance to review them. We understand that in some cases there may be results that are confusing or concerning to you. Please understand that not all results are received at the same time and often the doctors may need to interpret multiple results in order to provide you with the best plan of care or course of treatment. Therefore, we ask that you please give us  2 business days to thoroughly review all your results before contacting the office for clarification. Should we see a critical lab result, you will be contacted sooner.   If You Need Anything After Your Visit  If you have any questions or concerns for your doctor, please call our main line at [phone] and press option 4 to reach your doctor's medical assistant. If no one answers, please leave a voicemail as directed and we will return your call as soon as possible. Messages left after 4 pm will be answered the following business day.   You may also send us  a message via  MyChart. We typically respond to MyChart messages within 1-2 business days.  For prescription refills, please ask your pharmacy to contact our office. Our fax number is [phone].  If you have an urgent issue when the clinic is closed that cannot wait until the next business day, you can page your doctor at the number below.    Please note that while we do our best to be available for urgent issues outside of office hours, we are not available 24/7.   If you have an urgent issue and are unable to reach us , you may choose to seek medical care at  your doctor's office, retail clinic, urgent care center, or emergency room.  If you have a medical emergency, please immediately call 911 or go to the emergency department.  Pager Numbers  - Dr. Hester: [phone]  - Dr. Jackquline: [phone]  - Dr. Claudene: [phone]   In the event of inclement weather, please call our main line at [phone] for an update on the status of any delays or closures.  Dermatology Medication Tips: Please keep the boxes that topical medications come in in order to help keep track of the instructions about where and how to use these. Pharmacies typically print the medication instructions only on the boxes and not directly on the medication tubes.   If your medication is too expensive, please contact our office at [phone] option 4 or send us  a message through MyChart.   We are unable to tell what your co-pay for medications will be in advance as this is different depending on your insurance coverage. However, we may be able to find a substitute medication at lower cost or fill out paperwork to get insurance to cover a needed medication.   If a prior authorization is required to get your medication covered by your insurance company, please allow us  1-2 business days to complete this process.  Drug prices often vary depending on where the prescription is filled and some pharmacies may offer cheaper prices.  The website www.goodrx.com contains coupons for medications through different pharmacies. The prices here do not account for what the cost may be with help from insurance (it may be cheaper with your insurance), but the website can give you the [place] if you did not use any insurance.  - You can print the associated coupon and take it with your prescription to the pharmacy.  - You may also stop by our office during regular business hours and pick up a GoodRx coupon card.  - If you need your prescription sent electronically to a different pharmacy,  notify our office through [facility] or by phone at [phone] option 4.     Si Usted Necesita Algo Despus de Su Visita  Tambin puede enviarnos un mensaje a travs de Clinical cytogeneticist. Por lo general respondemos a los mensajes de MyChart en el transcurso de 1 a 2 das hbiles.  Para renovar recetas, por favor pida a su farmacia que se ponga en contacto con nuestra oficina. Randi lakes de fax es [place] [phone].  Si tiene un asunto urgente cuando la clnica est cerrada y que no puede esperar hasta el siguiente da hbil, puede llamar/localizar a su doctor(a) al nmero que aparece a continuacin.   Por favor, tenga en cuenta que aunque hacemos todo lo posible para estar disponibles para asuntos urgentes fuera del horario de [place], no estamos disponibles las 24 horas del da, los 7 [address]  [address] de la [place].   Si tiene un problema urgente y  no puede comunicarse con nosotros, puede optar por buscar atencin mdica  en el consultorio de su doctor(a), en una clnica privada, en un centro de atencin urgente o en una sala de emergencias.  Si tiene Engineer, drilling, por favor llame inmediatamente al 911 o vaya a la sala de emergencias.  Nmeros de bper  - Dr. Hester: [phone]  - Dra. Jackquline: [phone]  - Dr. Claudene: [phone]   En caso de inclemencias del tiempo, por favor llame a landry capes principal al [phone] para una actualizacin sobre el [place] de cualquier retraso o cierre.  Consejos para la medicacin en dermatologa: Por favor, guarde las cajas en las que vienen los medicamentos de uso tpico para ayudarle a seguir las instrucciones sobre dnde y cmo usarlos. Las farmacias generalmente imprimen las instrucciones del medicamento slo en las cajas y no directamente en los tubos del [place].   Si su medicamento es muy caro, por favor, pngase en contacto con landry rieger llamando al [phone] y presione la opcin 4 o envenos un mensaje a travs de  Clinical cytogeneticist.   No podemos decirle cul ser su copago por los medicamentos por adelantado ya que esto es diferente dependiendo de la cobertura de su seguro. Sin embargo, es posible que podamos encontrar un medicamento sustituto a Audiological scientist un formulario para que el seguro cubra el medicamento que se considera necesario.   Si se requiere una autorizacin previa para que su compaa de seguros malta su medicamento, por favor permtanos de 1 a 2 das hbiles para completar este proceso.  Los precios de los medicamentos varan con frecuencia dependiendo del Environmental consultant de dnde se surte la receta y alguna farmacias pueden ofrecer precios ms baratos.  El sitio web www.goodrx.com tiene cupones para medicamentos de Health and safety inspector. Los precios aqu no tienen en cuenta lo que podra costar con la ayuda del seguro (puede ser ms barato con su seguro), pero el sitio web puede darle el precio si no utiliz Tourist information centre manager.  - Puede imprimir el cupn correspondiente y llevarlo con su receta a la farmacia.  - Tambin puede pasar por nuestra oficina durante el horario de atencin regular y Education officer, museum una tarjeta de cupones de GoodRx.  - Si necesita que su receta se enve electrnicamente a una farmacia diferente, informe a nuestra oficina a travs de MyChart de [place] o por telfono llamando al [phone] y presione la opcin 4.

## 2024-05-10 ENCOUNTER — Other Ambulatory Visit

## 2024-05-16 ENCOUNTER — Ambulatory Visit: Admitting: Urology

## 2024-09-19 ENCOUNTER — Ambulatory Visit: Admitting: Dermatology
